# Patient Record
Sex: Male | Born: 1945 | Race: White | Hispanic: No | Marital: Married | Smoking: Never smoker
Health system: Southern US, Community
[De-identification: ages and names within clinical notes are randomized; demographics above are authoritative.]

## PROBLEM LIST (undated history)

## (undated) DIAGNOSIS — Z8673 Personal history of transient ischemic attack (TIA), and cerebral infarction without residual deficits: Secondary | ICD-10-CM

## (undated) DIAGNOSIS — Z87898 Personal history of other specified conditions: Secondary | ICD-10-CM

## (undated) HISTORY — PX: TONSILLECTOMY: SUR1361

## (undated) HISTORY — PX: KNEE SURGERY: SHX244

## (undated) HISTORY — DX: Personal history of other specified conditions: Z87.898

## (undated) HISTORY — DX: Personal history of transient ischemic attack (TIA), and cerebral infarction without residual deficits: Z86.73

---

## 2021-02-05 ENCOUNTER — Other Ambulatory Visit: Payer: Self-pay

## 2021-02-05 ENCOUNTER — Emergency Department (HOSPITAL_COMMUNITY): Payer: 59

## 2021-02-05 ENCOUNTER — Encounter (HOSPITAL_COMMUNITY): Payer: Self-pay | Admitting: Emergency Medicine

## 2021-02-05 ENCOUNTER — Observation Stay (HOSPITAL_COMMUNITY)
Admission: EM | Admit: 2021-02-05 | Discharge: 2021-02-06 | Disposition: A | Discharge status: Home / Self Care | Payer: 59 | Attending: Family Medicine | Admitting: Family Medicine

## 2021-02-05 DIAGNOSIS — Z79899 Other long term (current) drug therapy: Secondary | ICD-10-CM | POA: Insufficient documentation

## 2021-02-05 DIAGNOSIS — I679 Cerebrovascular disease, unspecified: Principal | ICD-10-CM | POA: Insufficient documentation

## 2021-02-05 DIAGNOSIS — R42 Dizziness and giddiness: Secondary | ICD-10-CM

## 2021-02-05 DIAGNOSIS — I719 Aortic aneurysm of unspecified site, without rupture: Secondary | ICD-10-CM | POA: Insufficient documentation

## 2021-02-05 DIAGNOSIS — Y900 Blood alcohol level of less than 20 mg/100 ml: Secondary | ICD-10-CM | POA: Diagnosis not present

## 2021-02-05 DIAGNOSIS — R030 Elevated blood-pressure reading, without diagnosis of hypertension: Secondary | ICD-10-CM | POA: Insufficient documentation

## 2021-02-05 DIAGNOSIS — Z20822 Contact with and (suspected) exposure to covid-19: Secondary | ICD-10-CM | POA: Diagnosis not present

## 2021-02-05 DIAGNOSIS — I639 Cerebral infarction, unspecified: Secondary | ICD-10-CM | POA: Diagnosis not present

## 2021-02-05 LAB — DIFFERENTIAL
Abs Immature Granulocytes: 0.03 10*3/uL (ref 0.00–0.07)
Basophils Absolute: 0.1 10*3/uL (ref 0.0–0.1)
Basophils Relative: 1 %
Eosinophils Absolute: 0.3 10*3/uL (ref 0.0–0.5)
Eosinophils Relative: 4 %
Immature Granulocytes: 1 %
Lymphocytes Relative: 29 %
Lymphs Abs: 1.8 10*3/uL (ref 0.7–4.0)
Monocytes Absolute: 0.7 10*3/uL (ref 0.1–1.0)
Monocytes Relative: 12 %
Neutro Abs: 3.3 10*3/uL (ref 1.7–7.7)
Neutrophils Relative %: 53 %

## 2021-02-05 LAB — RAPID URINE DRUG SCREEN, HOSP PERFORMED
Amphetamines: NOT DETECTED
Barbiturates: NOT DETECTED
Benzodiazepines: NOT DETECTED
Cocaine: NOT DETECTED
Opiates: NOT DETECTED
Tetrahydrocannabinol: NOT DETECTED

## 2021-02-05 LAB — APTT: aPTT: 29 seconds (ref 24–36)

## 2021-02-05 LAB — CBG MONITORING, ED: Glucose-Capillary: 87 mg/dL (ref 70–99)

## 2021-02-05 LAB — I-STAT CHEM 8, ED
BUN: 44 mg/dL — ABNORMAL HIGH (ref 8–23)
Calcium, Ion: 1.08 mmol/L — ABNORMAL LOW (ref 1.15–1.40)
Chloride: 109 mmol/L (ref 98–111)
Creatinine, Ser: 1.3 mg/dL — ABNORMAL HIGH (ref 0.61–1.24)
Glucose, Bld: 93 mg/dL (ref 70–99)
HCT: 49 % (ref 39.0–52.0)
Hemoglobin: 16.7 g/dL (ref 13.0–17.0)
Potassium: 5.9 mmol/L — ABNORMAL HIGH (ref 3.5–5.1)
Sodium: 140 mmol/L (ref 135–145)
TCO2: 25 mmol/L (ref 22–32)

## 2021-02-05 LAB — URINALYSIS, ROUTINE W REFLEX MICROSCOPIC
Bilirubin Urine: NEGATIVE
Glucose, UA: NEGATIVE mg/dL
Hgb urine dipstick: NEGATIVE
Ketones, ur: NEGATIVE mg/dL
Leukocytes,Ua: NEGATIVE
Nitrite: NEGATIVE
Protein, ur: NEGATIVE mg/dL
Specific Gravity, Urine: 1.026 (ref 1.005–1.030)
pH: 5 (ref 5.0–8.0)

## 2021-02-05 LAB — COMPREHENSIVE METABOLIC PANEL
ALT: 25 U/L (ref 0–44)
AST: 22 U/L (ref 15–41)
Albumin: 4.1 g/dL (ref 3.5–5.0)
Alkaline Phosphatase: 100 U/L (ref 38–126)
Anion gap: 8 (ref 5–15)
BUN: 27 mg/dL — ABNORMAL HIGH (ref 8–23)
CO2: 26 mmol/L (ref 22–32)
Calcium: 9.6 mg/dL (ref 8.9–10.3)
Chloride: 108 mmol/L (ref 98–111)
Creatinine, Ser: 1.35 mg/dL — ABNORMAL HIGH (ref 0.61–1.24)
GFR, Estimated: 55 mL/min — ABNORMAL LOW (ref >60)
Glucose, Bld: 95 mg/dL (ref 70–99)
Potassium: 4.5 mmol/L (ref 3.5–5.1)
Sodium: 142 mmol/L (ref 135–145)
Total Bilirubin: 0.6 mg/dL (ref 0.3–1.2)
Total Protein: 6.6 g/dL (ref 6.5–8.1)

## 2021-02-05 LAB — RESP PANEL BY RT-PCR (FLU A&B, COVID) ARPGX2
Influenza A by PCR: NEGATIVE
Influenza B by PCR: NEGATIVE
SARS Coronavirus 2 by RT PCR: NEGATIVE

## 2021-02-05 LAB — CBC
HCT: 50.5 % (ref 39.0–52.0)
Hemoglobin: 16.7 g/dL (ref 13.0–17.0)
MCH: 29.8 pg (ref 26.0–34.0)
MCHC: 33.1 g/dL (ref 30.0–36.0)
MCV: 90 fL (ref 80.0–100.0)
Platelets: 213 10*3/uL (ref 150–400)
RBC: 5.61 MIL/uL (ref 4.22–5.81)
RDW: 13.8 % (ref 11.5–15.5)
WBC: 6.2 10*3/uL (ref 4.0–10.5)
nRBC: 0 % (ref 0.0–0.2)

## 2021-02-05 LAB — PROTIME-INR
INR: 1.1 (ref 0.8–1.2)
Prothrombin Time: 13.8 seconds (ref 11.4–15.2)

## 2021-02-05 LAB — LDL CHOLESTEROL, DIRECT: Direct LDL: 86.5 mg/dL (ref 0–99)

## 2021-02-05 LAB — ETHANOL: Alcohol, Ethyl (B): <10 mg/dL (ref <10)

## 2021-02-05 IMAGING — MR MR HEAD W/O CM
6 of 11 series · 24 of 48 positions shown · IV contrast (gadavist)
Comparison: No pertinent prior exam.

CLINICAL DATA: Neuro deficit, acute, stroke suspected

EXAM:
MRI HEAD WITHOUT CONTRAST
MRA HEAD WITHOUT CONTRAST
MRA OF THE NECK WITHOUT AND WITH CONTRAST
TECHNIQUE: Multiplanar, multi-echo pulse sequences of the brain and surrounding
structures were acquired without intravenous contrast. Angiographic
images of the Circle of Willis were acquired using MRA technique
without intravenous contrast. Angiographic images of the neck were
acquired using MRA technique without and with intravenous contrast.
Carotid stenosis measurements (when applicable) are obtained
utilizing NASCET criteria, using the distal internal carotid
diameter as the denominator.
CONTRAST:  9.5mL GADAVIST GADOBUTROL 1 MMOL/ML IV SOLN

[Series 3: DWI · axial · 3.0mm · 0.94mm/px · z∈[-34,+122]mm · 7 of 108 slices shown (1 of 2)]
[im 1/108]
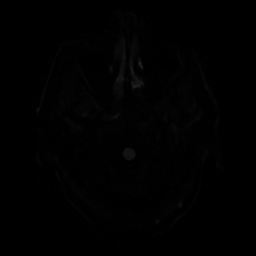
[im 18/108]
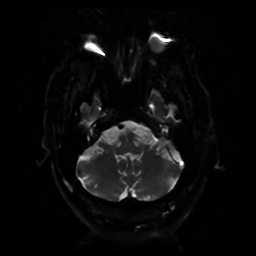
[im 36/108]
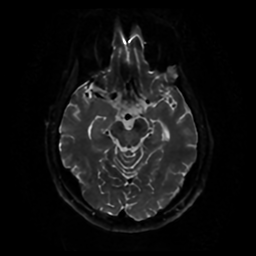
[im 54/108]
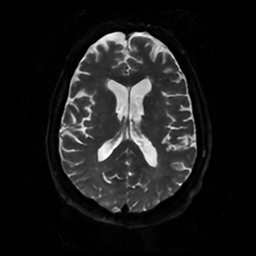
[im 72/108]
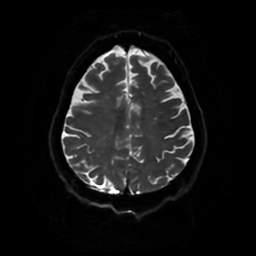
[im 90/108]
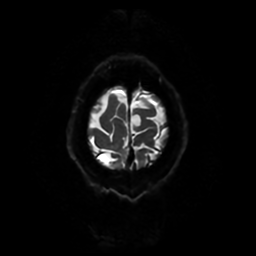
[im 108/108]
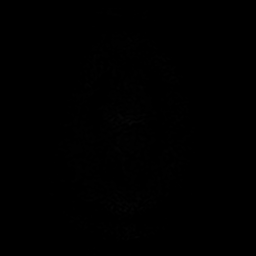

[Series 4: DWI · coronal · 4.0mm · 0.94mm/px · 5 of 77 slices shown (2 of 2)]
[im 1/77]
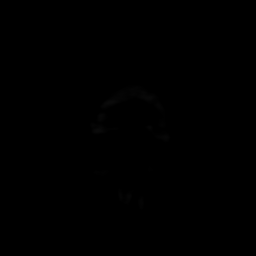
[im 20/77]
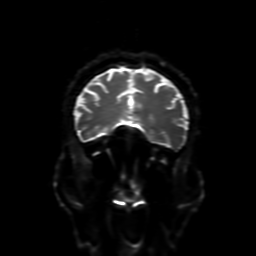
[im 39/77]
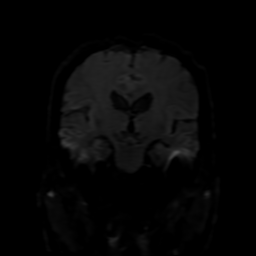
[im 58/77]
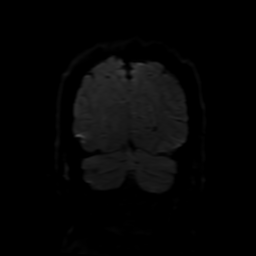
[im 77/77]
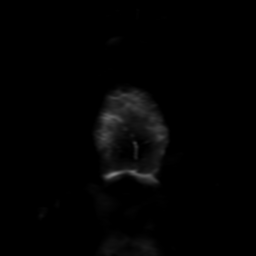

[Series 5: FLAIR · sagittal · 5.0mm · 0.23mm/px · 2 of 27 slices shown (1 of 2)]
[im 1/27]
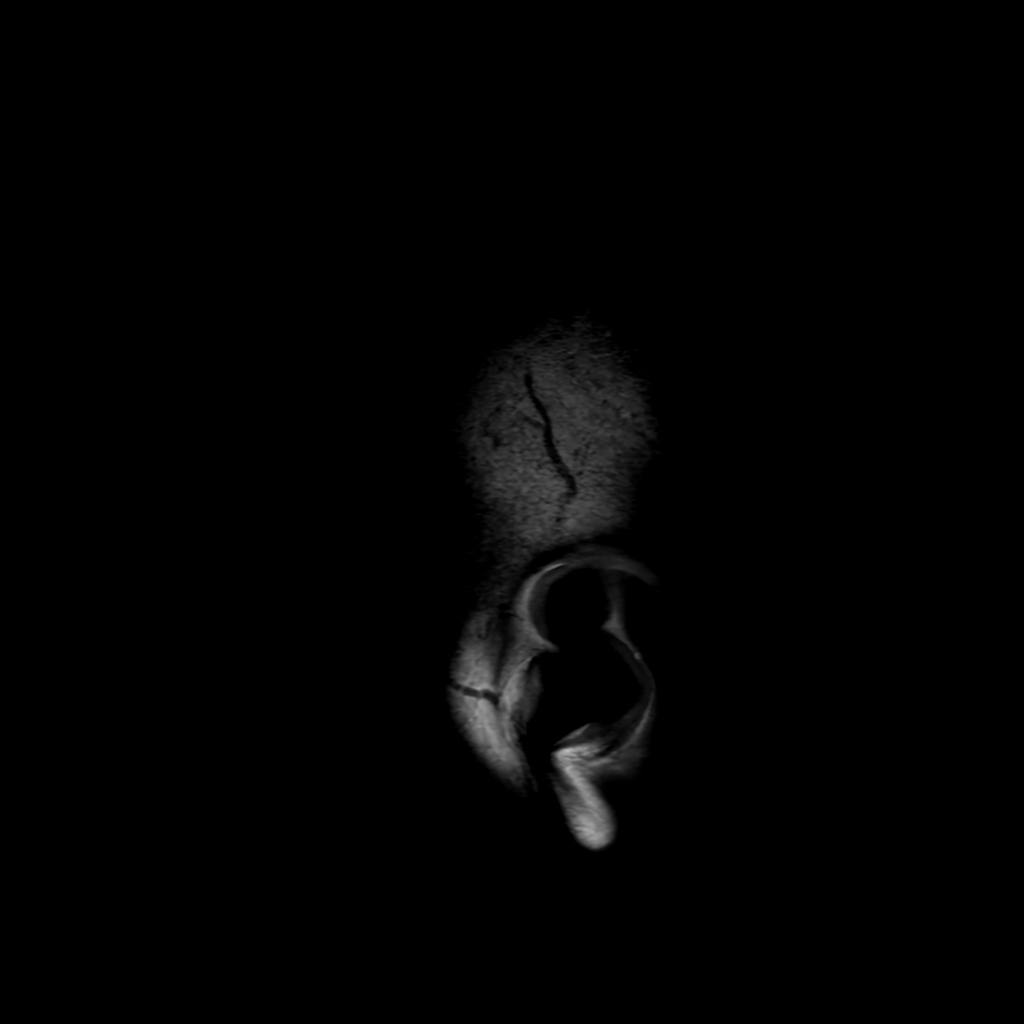
[im 27/27]
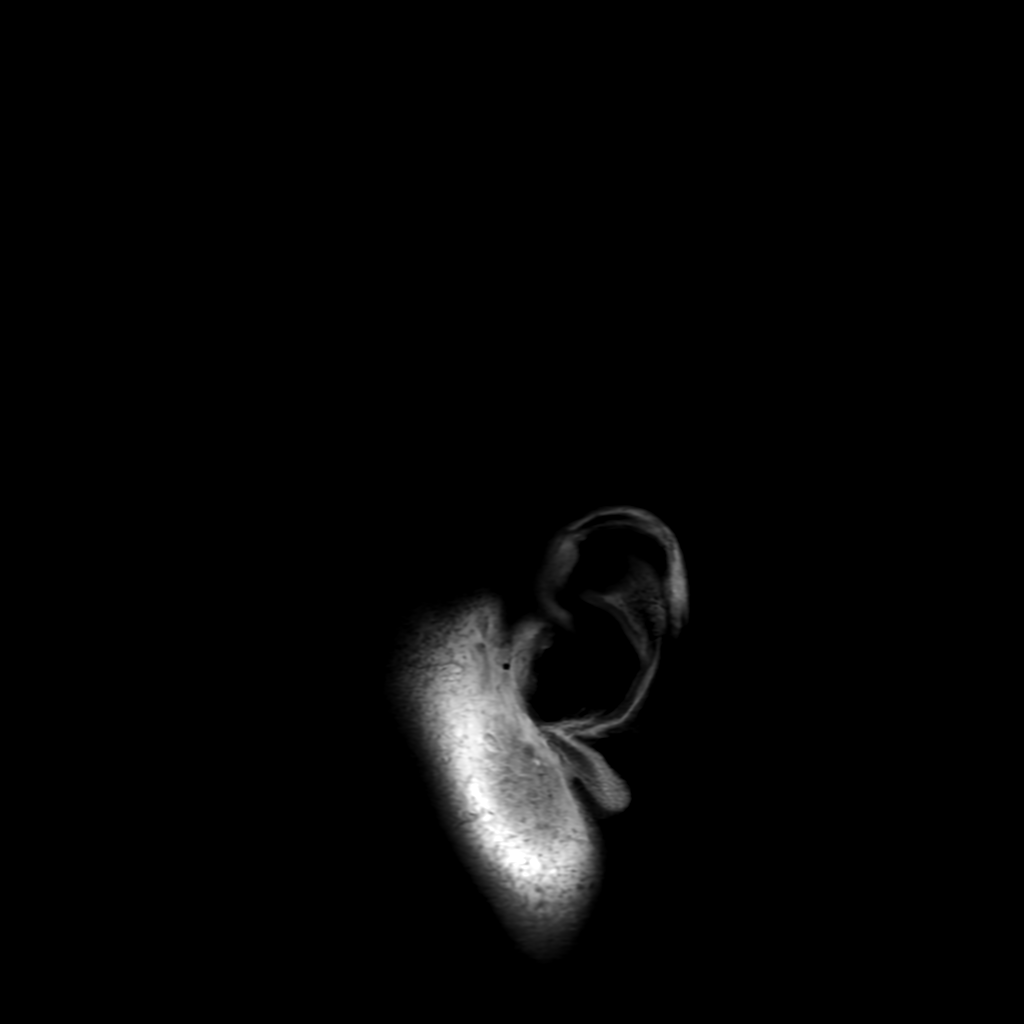

[Series 7: FLAIR · axial · 4.0mm · 0.45mm/px · z∈[-29,+123]mm · 3 of 36 slices shown (2 of 2)]
[im 1/36]
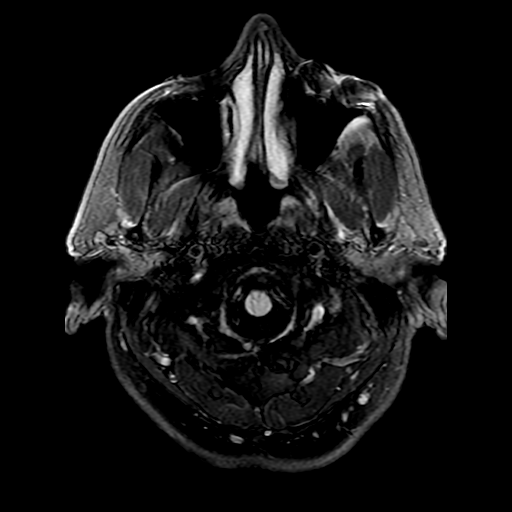
[im 18/36]
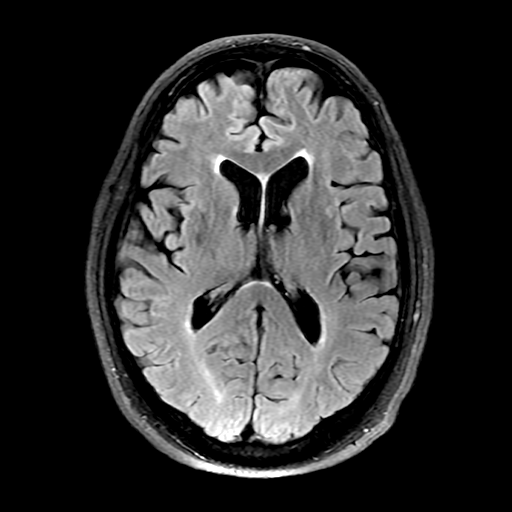
[im 36/36]
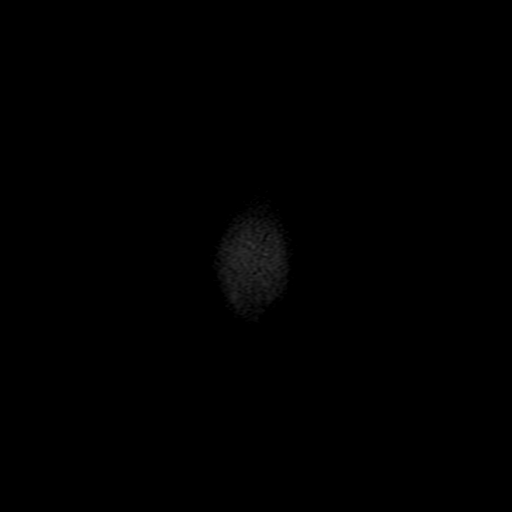

[Series 350: ADC · axial · 3.0mm · 0.94mm/px · z∈[-34,+122]mm · 4 of 54 slices shown (1 of 2)]
[im 1/54]
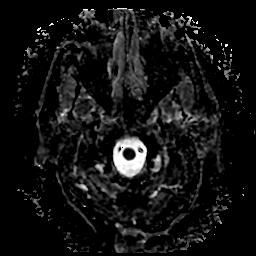
[im 18/54]
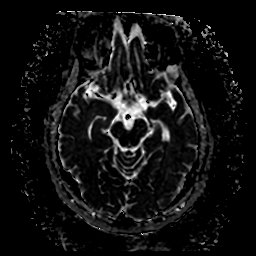
[im 36/54]
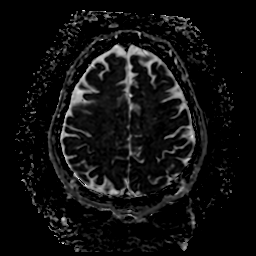
[im 54/54]
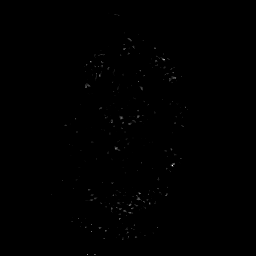

[Series 450: ADC · coronal · 4.0mm · 0.94mm/px · 3 of 39 slices shown (2 of 2)]
[im 1/39]
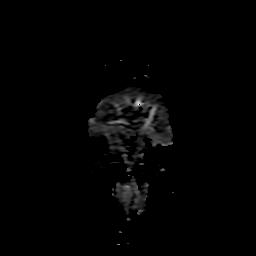
[im 20/39]
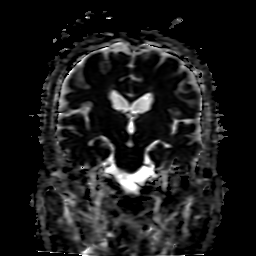
[im 39/39]
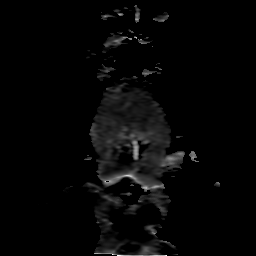

[24 of 48 positions shown; findings below may reference images not displayed]

FINDINGS: MR HEAD FINDINGS

Brain: There is hyperintensity diffusion-weighted imaging the
paramedian superior left frontal lobe. No acute or chronic
hemorrhage. There is multifocal hyperintense T2-weighted signal
within the white matter. Generalized volume loss without a clear
lobar predilection. The midline structures are normal.

Vascular: Major flow voids are preserved.

Skull and upper cervical spine: Normal calvarium and skull base.
Visualized upper cervical spine and soft tissues are normal.

Sinuses/Orbits:No paranasal sinus fluid levels or advanced mucosal
thickening. No mastoid or middle ear effusion. Normal orbits.

MRA HEAD FINDINGS

POSTERIOR CIRCULATION:

--Vertebral arteries: Normal

--Inferior cerebellar arteries: Normal.

--Basilar artery: Normal.

--Superior cerebellar arteries: Normal.

--Posterior cerebral arteries: Normal.

ANTERIOR CIRCULATION:

--Intracranial internal carotid arteries: Normal.

--Anterior cerebral arteries (ACA): Normal.

--Middle cerebral arteries (MCA): Normal.

ANATOMIC VARIANTS: None

MRA NECK FINDINGS

Aortic arch: Standard branching pattern

Right carotid system: Normal

Left carotid system: Normal

Vertebral arteries: Normal left dominant system.

Other: None.
IMPRESSION: 1. Small focus of acute ischemia within the paramedian superior left
frontal lobe. No hemorrhage or mass effect.
2. Normal MRA of the head and neck.
3. Mild chronic small vessel disease and volume loss.

## 2021-02-05 IMAGING — CT CT HEAD CODE STROKE
3 series · 15 of 47 positions shown, 18 images · non-contrast
Comparison: None.

CLINICAL DATA: Code stroke.  Neuro deficit, acute stroke suspected.

EXAM:
CT HEAD WITHOUT CONTRAST
TECHNIQUE: Contiguous axial images were obtained from the base of the skull
through the vertex without intravenous contrast.

[Series 3: head 5.0 st · axial · 0.43mm/px · z∈[-172,-37]mm · 9 of 33 slices shown, 12 images]
[im 3/33  brain]
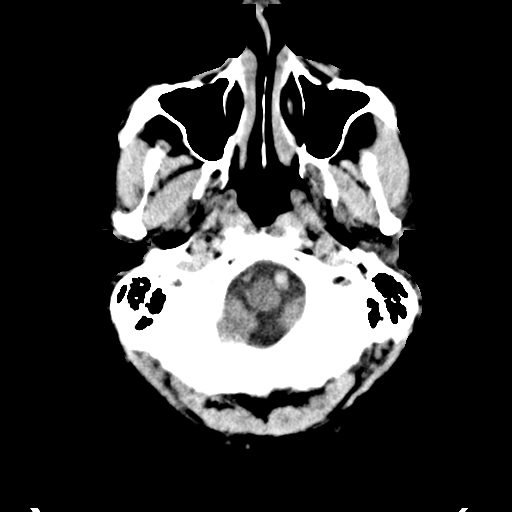
[im 3/33  bone]
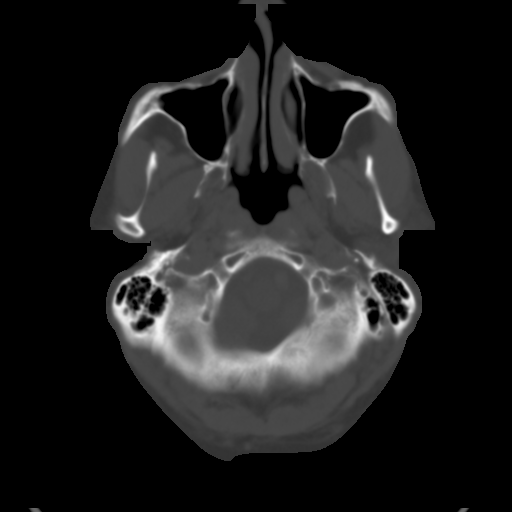
[im 6/33  brain]
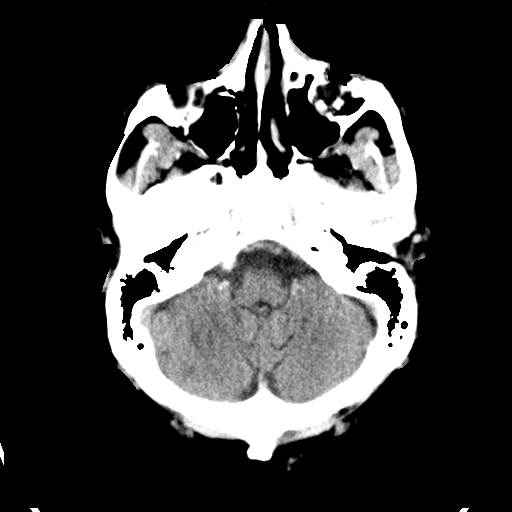
[im 9/33  brain]
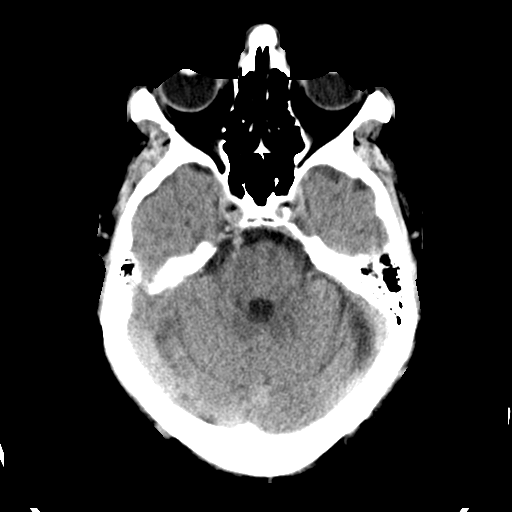
[im 13/33  brain]
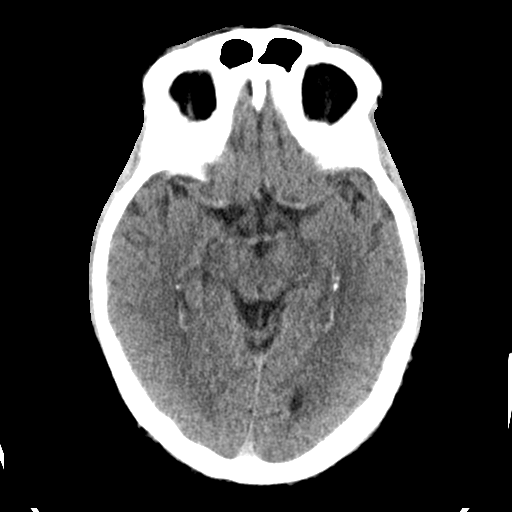
[im 17/33  brain]
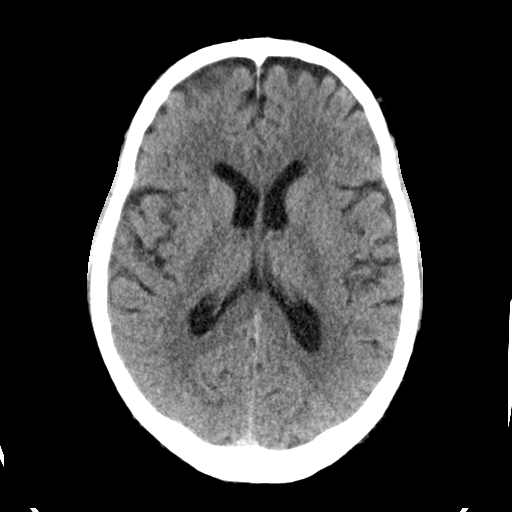
[im 17/33  bone]
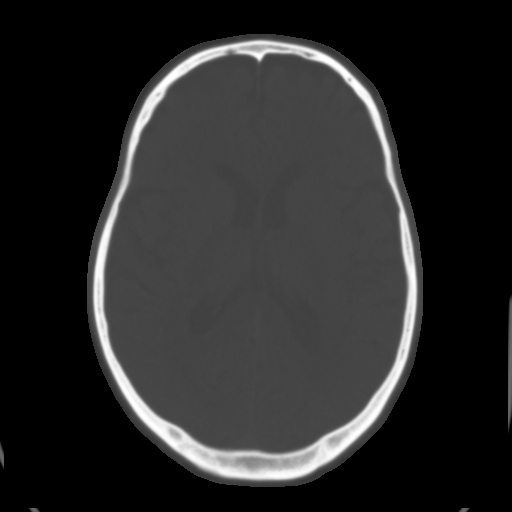
[im 20/33  brain]
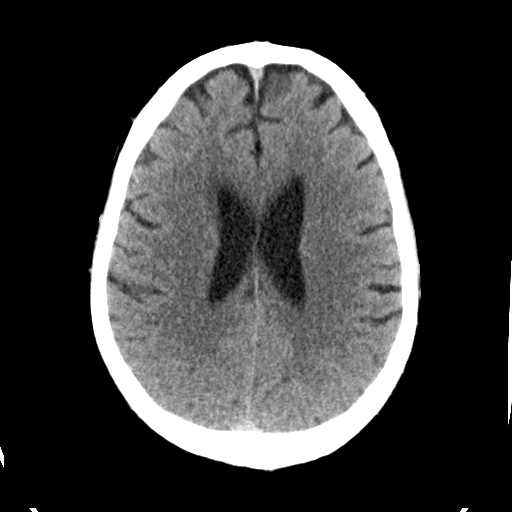
[im 24/33  brain]
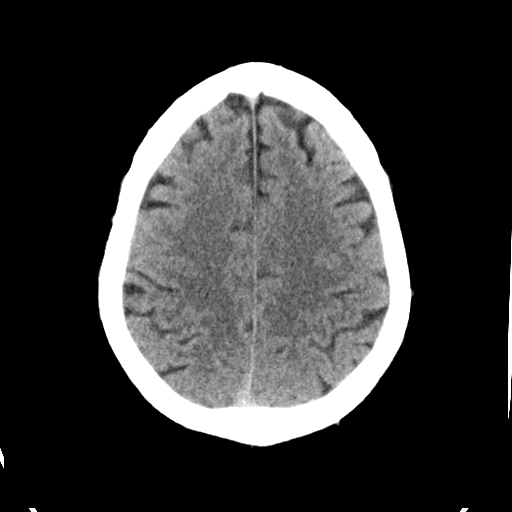
[im 27/33  brain]
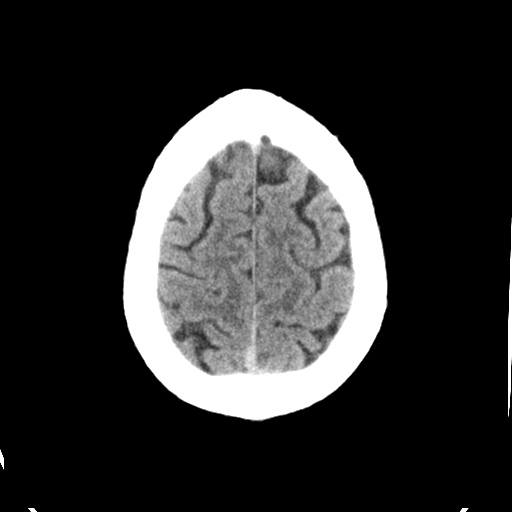
[im 30/33  brain]
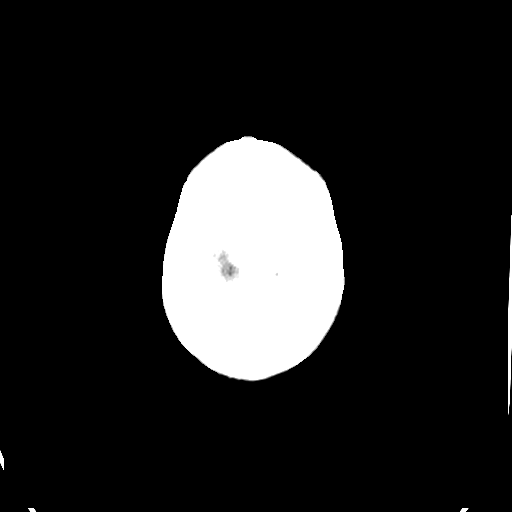
[im 30/33  bone]
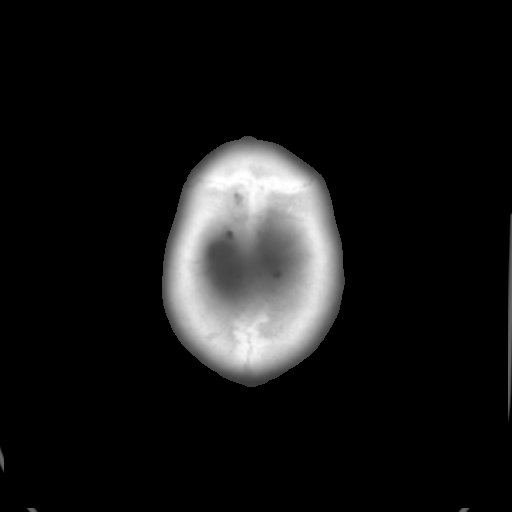

[Series 5: head 3.0 cor st · coronal · 0.32mm/px · 3 of 67 slices shown]
[im 23/67  brain]
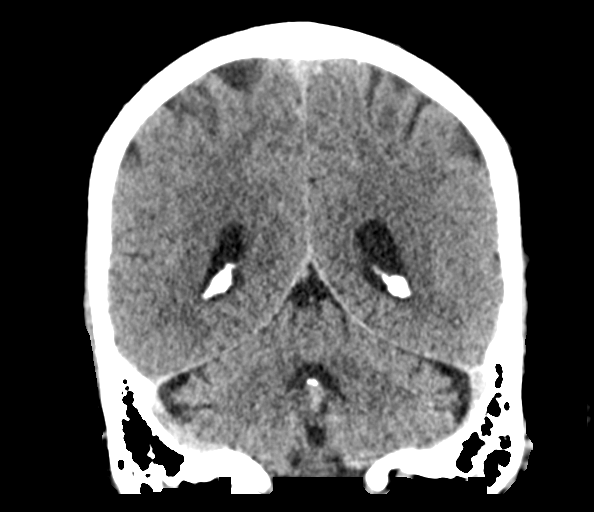
[im 30/67  brain]
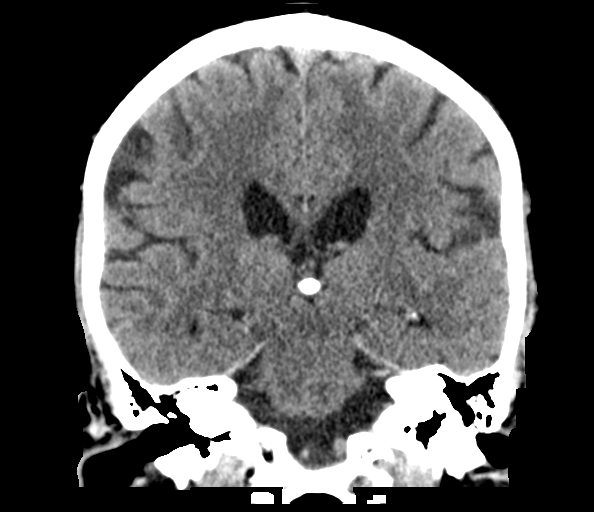
[im 37/67  brain]
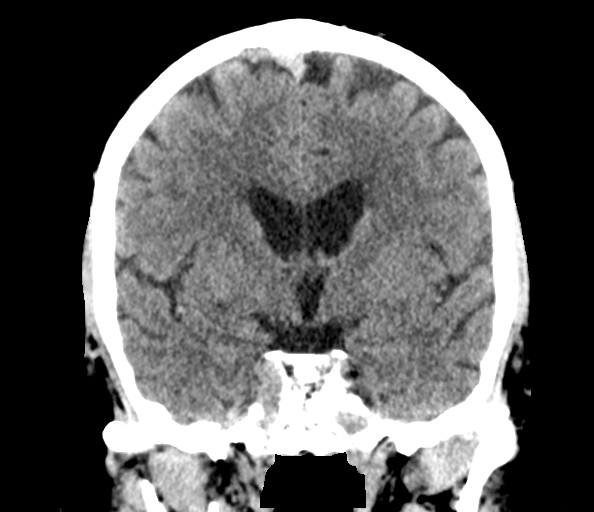

[Series 6: head 3.0 sag st · sagittal · 0.32mm/px · 3 of 64 slices shown]
[im 22/64  brain]
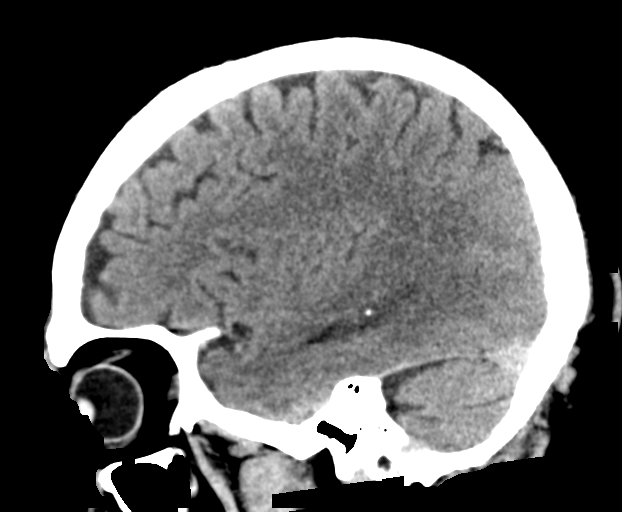
[im 32/64  brain]
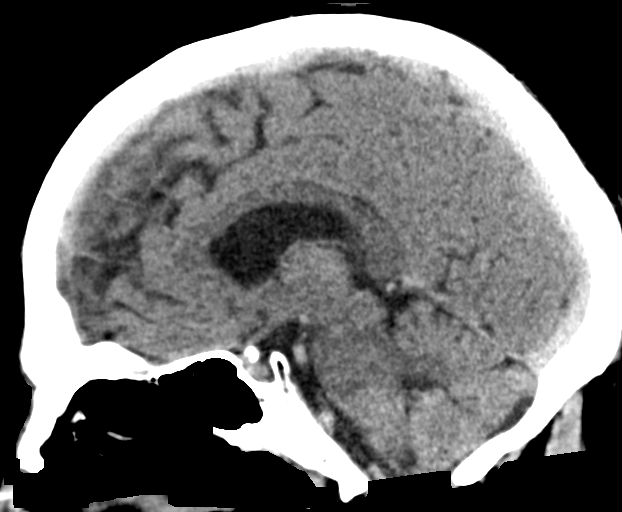
[im 43/64  brain]
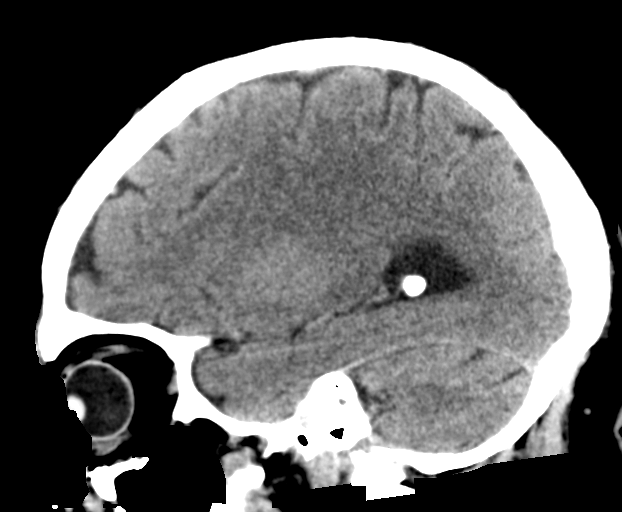

[15 of 47 positions shown; findings below may reference images not displayed]

FINDINGS: Brain: Hypodensity within the paramidline left cerebellar
hemisphere, compatible with the age indeterminate lacunar infarct.

Vascular: No hyperdense vessel identified. Calcific atherosclerosis.

Skull: No acute fracture.

Sinuses/Orbits: Clear sinuses.

Other: No mastoid effusions.

ASPECTS ([place] Stroke Program Early CT Score) Total score (0-10
with 10 being normal): 10.
IMPRESSION: 1. Age indeterminate small left cerebellar lacunar infarct, possibly
remote. An MRI could better evaluate if clinically indicated.
2. No evidence of acute large vascular territory infarct or acute
hemorrhage. ASPECTS is 10.

Code stroke imaging results were communicated on [DATE] at [DATE] to provider [REDACTED] via secure text paging.

## 2021-02-05 IMAGING — MR MR MRA NECK WO/W CM
4 of 7 series · 18 of 48 positions shown · IV contrast (9.5 M GAD)
Comparison: No pertinent prior exam.

CLINICAL DATA: Neuro deficit, acute, stroke suspected

EXAM:
MRI HEAD WITHOUT CONTRAST
MRA HEAD WITHOUT CONTRAST
MRA OF THE NECK WITHOUT AND WITH CONTRAST
TECHNIQUE: Multiplanar, multi-echo pulse sequences of the brain and surrounding
structures were acquired without intravenous contrast. Angiographic
images of the Circle of Willis were acquired using MRA technique
without intravenous contrast. Angiographic images of the neck were
acquired using MRA technique without and with intravenous contrast.
Carotid stenosis measurements (when applicable) are obtained
utilizing NASCET criteria, using the distal internal carotid
diameter as the denominator.
CONTRAST:  9.5mL GADAVIST GADOBUTROL 1 MMOL/ML IV SOLN

[Series 400: cor cemra ft · coronal · 1.2mm · 0.59mm/px · 7 of 117 slices shown]
[im 1/117]
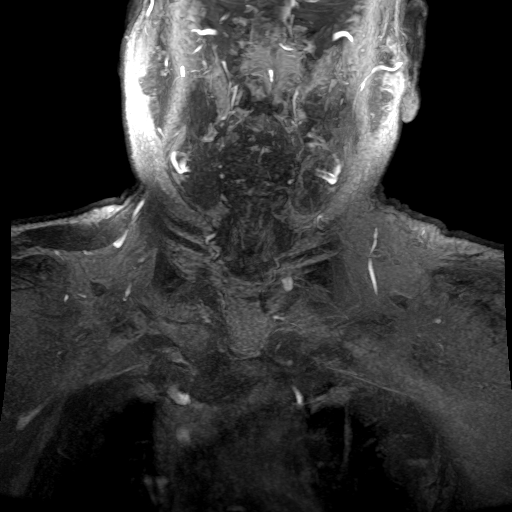
[im 20/117]
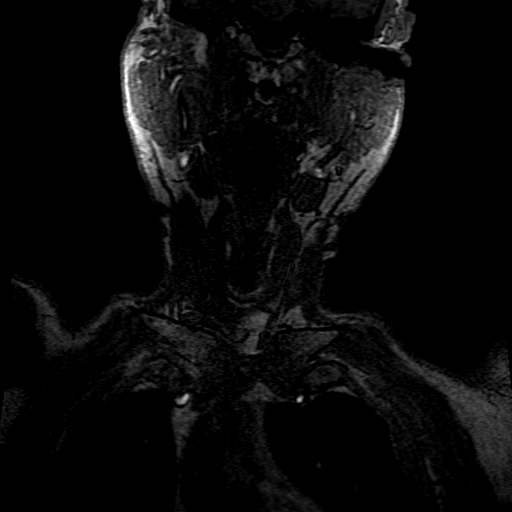
[im 39/117]
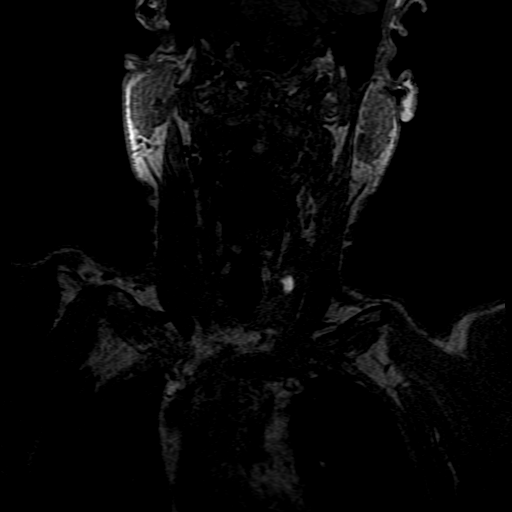
[im 59/117]
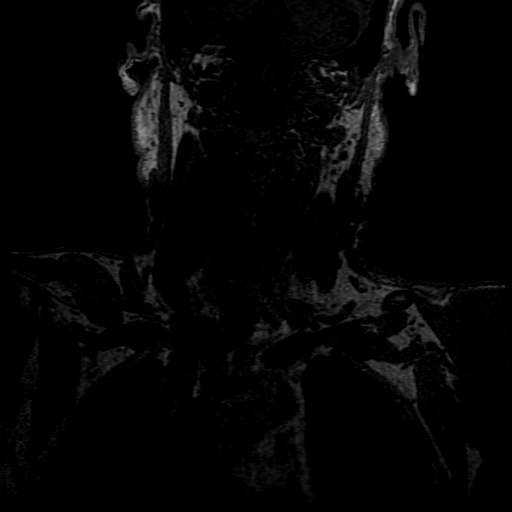
[im 78/117]
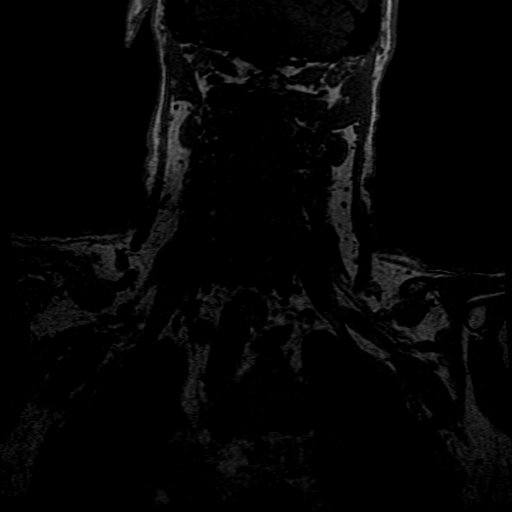
[im 97/117]
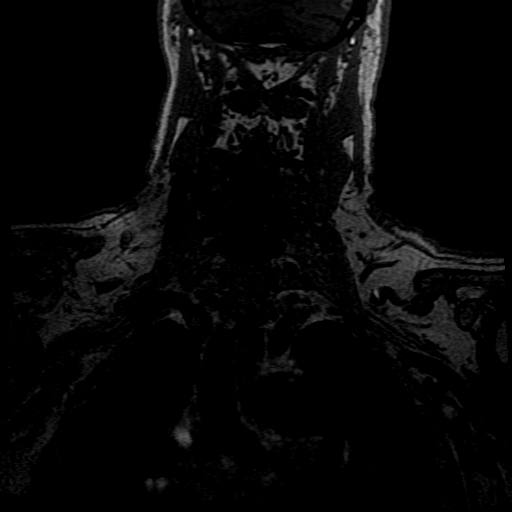
[im 117/117]
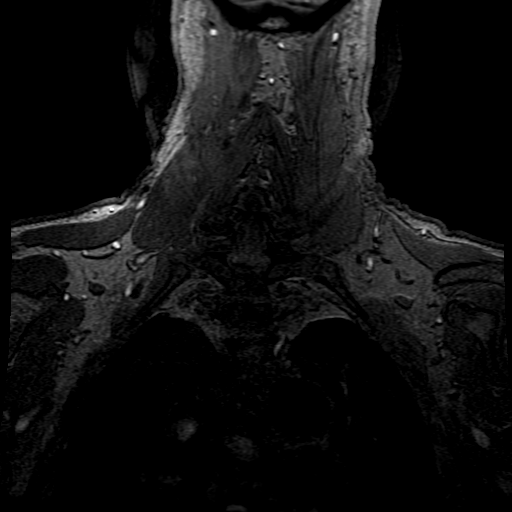

[Series 401: ph1/cor cemra ft · coronal · 1.2mm · 0.59mm/px · 5 of 116 slices shown]
[im 1/116]
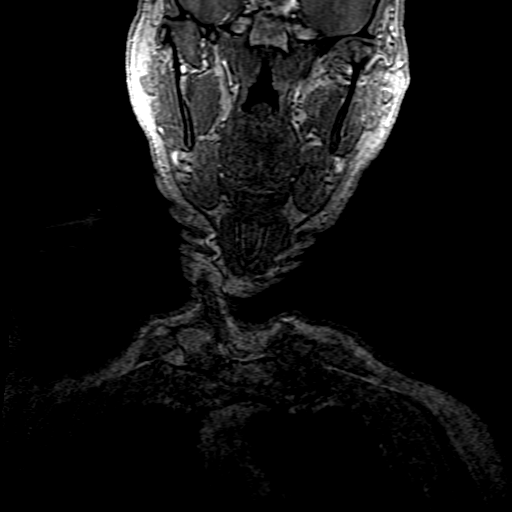
[im 20/116]
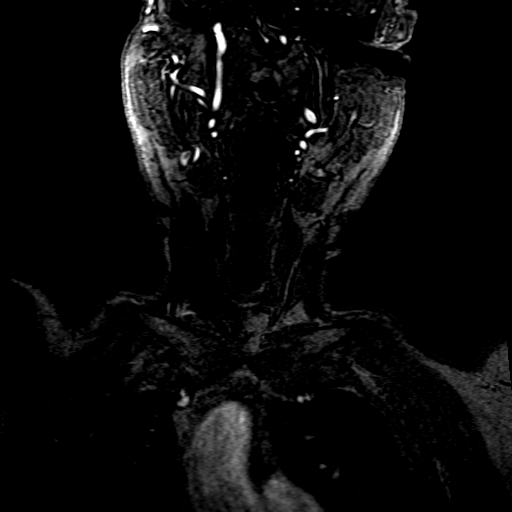
[im 39/116]
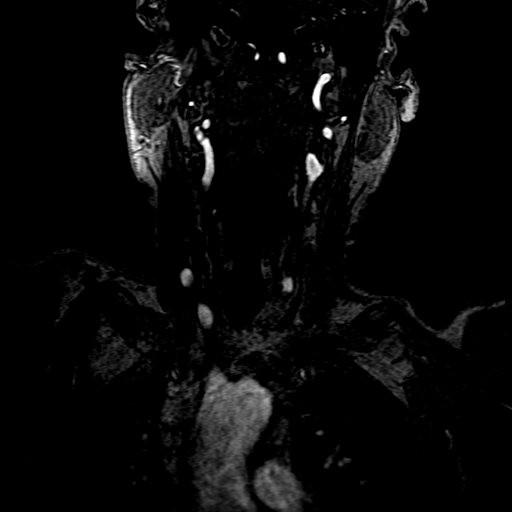
[im 58/116]
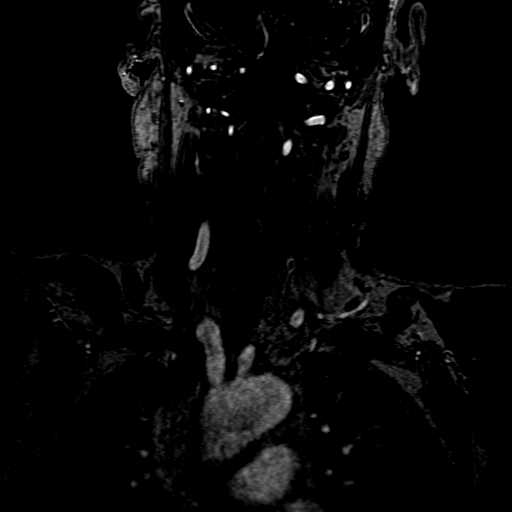
[im 96/116]
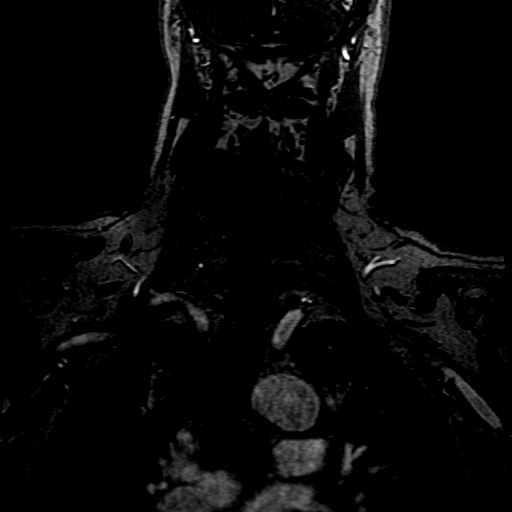

[Series 402: ph2/cor cemra ft · coronal · 1.2mm · 0.59mm/px · 3 of 117 slices shown]
[im 17/117]
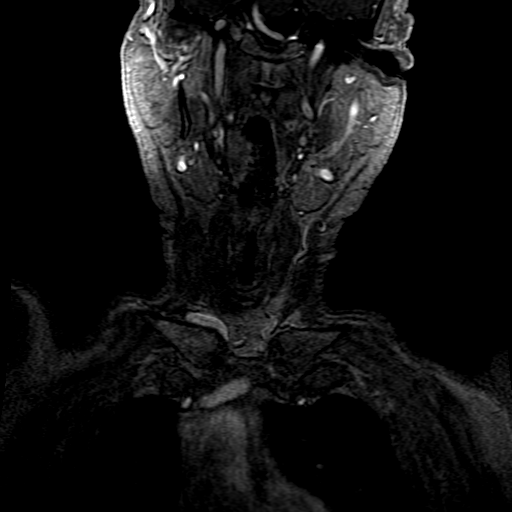
[im 67/117]
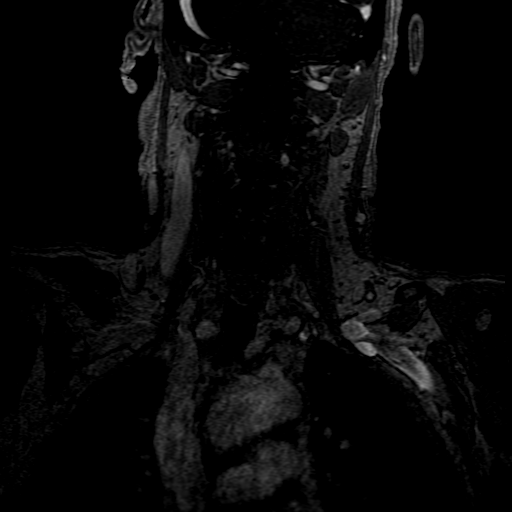
[im 100/117]
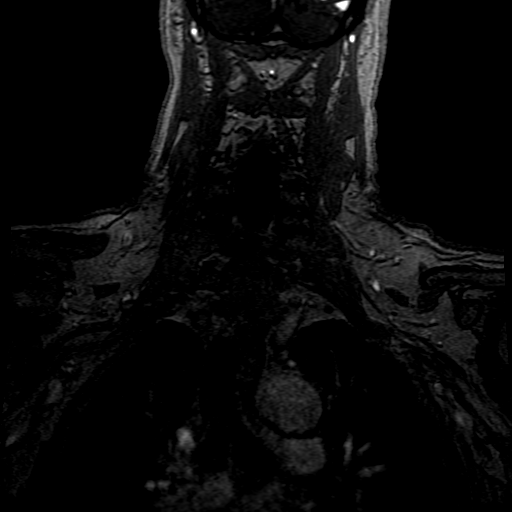

[((date))-((date)) · coronal · 1.2mm · 0.59mm/px · 3 of 117 slices shown]
[im 17/117]
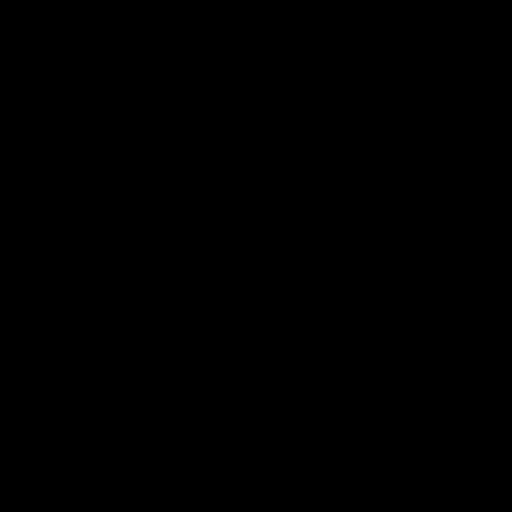
[im 67/117]
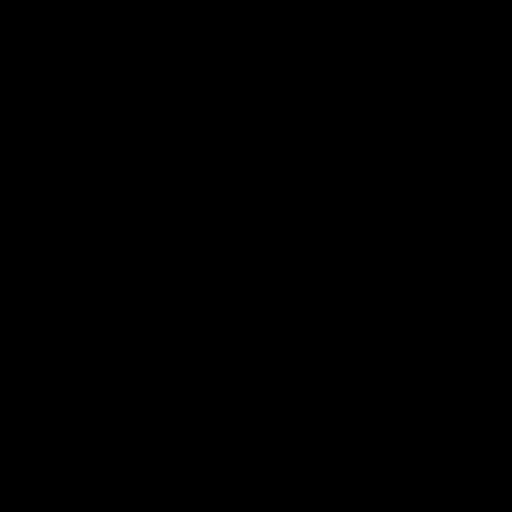
[im 100/117]
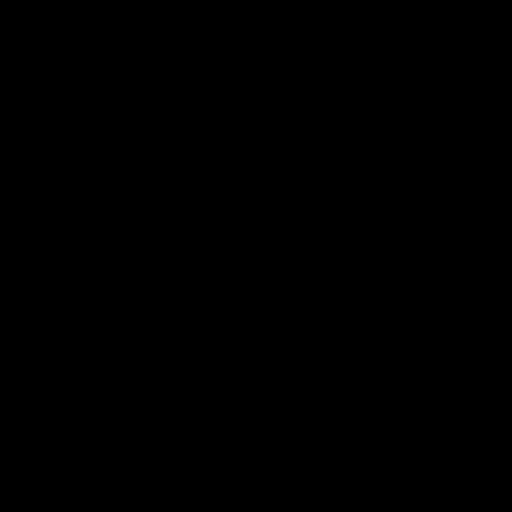

[18 of 48 positions shown; findings below may reference images not displayed]

FINDINGS: MR HEAD FINDINGS

Brain: There is hyperintensity diffusion-weighted imaging the
paramedian superior left frontal lobe. No acute or chronic
hemorrhage. There is multifocal hyperintense T2-weighted signal
within the white matter. Generalized volume loss without a clear
lobar predilection. The midline structures are normal.

Vascular: Major flow voids are preserved.

Skull and upper cervical spine: Normal calvarium and skull base.
Visualized upper cervical spine and soft tissues are normal.

Sinuses/Orbits:No paranasal sinus fluid levels or advanced mucosal
thickening. No mastoid or middle ear effusion. Normal orbits.

MRA HEAD FINDINGS

POSTERIOR CIRCULATION:

--Vertebral arteries: Normal

--Inferior cerebellar arteries: Normal.

--Basilar artery: Normal.

--Superior cerebellar arteries: Normal.

--Posterior cerebral arteries: Normal.

ANTERIOR CIRCULATION:

--Intracranial internal carotid arteries: Normal.

--Anterior cerebral arteries (ACA): Normal.

--Middle cerebral arteries (MCA): Normal.

ANATOMIC VARIANTS: None

MRA NECK FINDINGS

Aortic arch: Standard branching pattern

Right carotid system: Normal

Left carotid system: Normal

Vertebral arteries: Normal left dominant system.

Other: None.
IMPRESSION: 1. Small focus of acute ischemia within the paramedian superior left
frontal lobe. No hemorrhage or mass effect.
2. Normal MRA of the head and neck.
3. Mild chronic small vessel disease and volume loss.

## 2021-02-05 MED ORDER — ACETAMINOPHEN 650 MG RE SUPP: 650.0000 mg | RECTAL | Status: DC | PRN

## 2021-02-05 MED ORDER — GADOBUTROL 1 MMOL/ML IV SOLN
9.5000 mL | Freq: Once | INTRAVENOUS | Status: AC | PRN
  Administered 2021-02-05: 9.5 mL via INTRAVENOUS

## 2021-02-05 MED ORDER — HYDRALAZINE HCL 25 MG PO TABS: 25.0000 mg | ORAL_TABLET | Freq: Four times a day (QID) | ORAL | Status: DC | PRN

## 2021-02-05 MED ORDER — ASPIRIN 81 MG PO CHEW
81.0000 mg | CHEWABLE_TABLET | Freq: Every day | ORAL | Status: DC
  Administered 2021-02-05 – 2021-02-06 (×2): 81 mg via ORAL
  Filled 2021-02-05 (×2): qty 1

## 2021-02-05 MED ORDER — ASPIRIN 300 MG RE SUPP: 300.0000 mg | Freq: Every day | RECTAL | Status: DC

## 2021-02-05 MED ORDER — ATORVASTATIN CALCIUM 80 MG PO TABS
80.0000 mg | ORAL_TABLET | Freq: Every day | ORAL | Status: DC
Start: 2021-02-05 — End: 2021-02-06
  Administered 2021-02-05 – 2021-02-06 (×2): 80 mg via ORAL
  Filled 2021-02-05: qty 1
  Filled 2021-02-05: qty 2

## 2021-02-05 MED ORDER — STROKE: EARLY STAGES OF RECOVERY BOOK: Freq: Once | Status: DC

## 2021-02-05 MED ORDER — SENNOSIDES-DOCUSATE SODIUM 8.6-50 MG PO TABS: 1.0000 | ORAL_TABLET | Freq: Every evening | ORAL | Status: DC | PRN

## 2021-02-05 MED ORDER — ACETAMINOPHEN 160 MG/5ML PO SOLN: 650.0000 mg | ORAL | Status: DC | PRN

## 2021-02-05 MED ORDER — ENOXAPARIN SODIUM 40 MG/0.4ML IJ SOSY
40.0000 mg | PREFILLED_SYRINGE | INTRAMUSCULAR | Status: DC
  Administered 2021-02-05: 40 mg via SUBCUTANEOUS
  Filled 2021-02-05: qty 0.4

## 2021-02-05 MED ORDER — MECLIZINE HCL 25 MG PO TABS: 12.5000 mg | ORAL_TABLET | Freq: Three times a day (TID) | ORAL | Status: DC | PRN

## 2021-02-05 MED ORDER — ACETAMINOPHEN 325 MG PO TABS: 650.0000 mg | ORAL_TABLET | ORAL | Status: DC | PRN

## 2021-02-05 NOTE — ED Provider Notes (Signed)
Emergency Medicine Provider Triage Evaluation Note  Thomas Roberts , a 75 y.o. male  was evaluated in triage.  Pt complains of who presents with concern for right lower extremity weakness and sudden lightheadedness that started at approximately 345 this afternoon while walking at the grocery store.  No history of stroke in the past.  States that his leg is bent but getting progressively weaker since that time.  Denies any headache but does continue to have intermittent lightheadedness.  He is not anticoagulated.  Review of Systems  Positive: Right lower extremity weakness, lightheadedness Negative: Blurry, double vision, slurred speech, nausea, vomiting, diarrhea, chest pain, palpitations, shortness of breath  Physical Exam  BP (!) 157/90 (BP Location: Right Arm)   Pulse 66   Temp (!) 97.5 F (36.4 C) (Oral)   Resp 16   Ht 6\' 1"  (1.854 m)   Wt 92.1 kg   SpO2 99%   BMI 26.78 kg/m  Gen:   Awake, no distress   Resp:  Normal effort  MSK:   Moves extremities without difficulty  Other:  Cranial nerves are intact, PERRL, EOMI, sensation is intact in all 4 extremities.  Coordination is intact in the upper extremities bilaterally in the left lower extremity.  Significant right lower extremity weakness relative to the left both with knee flexion and extension and plantar and dorsiflexion.  Impaired coordination with right lower extremity as well.  Patient unable to stand due to weakness in the right leg.   Medical Decision Making  Medically screening exam initiated at 4:55 PM.  Appropriate orders placed.  [name] was informed that the remainder of the evaluation will be completed by another provider, this initial triage assessment does not replace that evaluation, and the importance of remaining in the ED until their evaluation is complete.  Code stroke activated, charge nurse notified, CT notified.  Patient assigned to room 32.  Labs being obtained and will be transported to CT 2.  This  chart was dictated using voice recognition software, Dragon. Despite the best efforts of this provider to proofread and correct errors, errors may still occur which can change documentation meaning.    Thomas Roberts 02/05/21 1659    Thomas Roberts, 02/07/21, MD 02/05/21 02/07/21

## 2021-02-05 NOTE — ED Provider Notes (Signed)
[facility] EMERGENCY DEPARTMENT Provider Note   CSN: [id number] Arrival date & time: 02/05/21  4098  An emergency department physician performed an initial assessment on this suspected stroke patient at 1700.  History Chief Complaint  Patient presents with   Dizziness   Weakness    Thomas Roberts is a 75 y.o. male.  The history is provided by the patient.  Dizziness Associated symptoms: weakness   Weakness Associated symptoms: dizziness   Thomas Roberts is a 75 y.o. male who presents to the Emergency Department complaining of weakness.  He presents to the ED complaining of acute RLE weakness and dizziness that started this afternoon.  He was last known well at 330 this afternoon. Sxs started while he was in line at the grocery store.  He felt lightheaded and his right leg kept buckling on him.  At time of ED arrival he states that sxs are starting to improve.  He has no known medical problems and takes no medications.     History reviewed. No pertinent past medical history.  Patient Active Problem List   Diagnosis Date Noted   Acute CVA (cerebrovascular accident) (HCC) 02/05/2021    History reviewed. No pertinent surgical history.     History reviewed. No pertinent family history.  Social History   Tobacco Use   Smoking status: Never   Smokeless tobacco: Never  Substance Use Topics   Alcohol use: Not Currently   Drug use: Not Currently    Home Medications Prior to Admission medications   Not on File    Allergies    Patient has no known allergies.  Review of Systems   Review of Systems  Neurological:  Positive for dizziness and weakness.  All other systems reviewed and are negative.  Physical Exam Updated Vital Signs BP (!) 152/91   Pulse 76   Temp (!) 97.5 F (36.4 C) (Oral)   Resp 12   Ht 6\' 1"  (1.854 m)   Wt 93.6 kg   SpO2 98%   BMI 27.22 kg/m   Physical Exam Vitals and nursing note reviewed.  Constitutional:       Appearance: He is well-developed.  HENT:     Head: Normocephalic and atraumatic.  Cardiovascular:     Rate and Rhythm: Normal rate and regular rhythm.     Heart sounds: No murmur heard. Pulmonary:     Effort: Pulmonary effort is normal. No respiratory distress.     Breath sounds: Normal breath sounds.  Abdominal:     Palpations: Abdomen is soft.     Tenderness: There is no abdominal tenderness. There is no guarding or rebound.  Musculoskeletal:        General: No swelling or tenderness.  Skin:    General: Skin is warm and dry.  Neurological:     Mental Status: He is alert and oriented to person, place, and time.     Comments: No asymmetry of facial movement.  5/5 strength in BUE.  No pronator drift.  There is 4+/5 strength in the RLE.  5/5 strength in the LLE.  Sensation to light touch intact in all four extremities.   Psychiatric:        Behavior: Behavior normal.    ED Results / Procedures / Treatments   Labs (all labs ordered are listed, but only abnormal results are displayed) Labs Reviewed  COMPREHENSIVE METABOLIC PANEL - Abnormal; Notable for the following components:      Result Value   BUN 27 (*)  Creatinine, Ser 1.35 (*)    GFR, Estimated 55 (*)    All other components within normal limits  I-STAT CHEM 8, ED - Abnormal; Notable for the following components:   Potassium 5.9 (*)    BUN 44 (*)    Creatinine, Ser 1.30 (*)    Calcium, Ion 1.08 (*)    All other components within normal limits  RESP PANEL BY RT-PCR (FLU A&B, COVID) ARPGX2  ETHANOL  PROTIME-INR  APTT  CBC  DIFFERENTIAL  RAPID URINE DRUG SCREEN, HOSP PERFORMED  URINALYSIS, ROUTINE W REFLEX MICROSCOPIC  LDL CHOLESTEROL, DIRECT  HEMOGLOBIN A1C  LIPID PANEL  CBG MONITORING, ED    EKG None  Radiology MR ANGIO HEAD WO CONTRAST  Result Date: 02/05/2021 CLINICAL DATA:  Neuro deficit, acute, stroke suspected EXAM: MRI HEAD WITHOUT CONTRAST MRA HEAD WITHOUT CONTRAST MRA OF THE NECK WITHOUT AND WITH  CONTRAST TECHNIQUE: Multiplanar, multi-echo pulse sequences of the brain and surrounding structures were acquired without intravenous contrast. Angiographic images of the Circle of Willis were acquired using MRA technique without intravenous contrast. Angiographic images of the neck were acquired using MRA technique without and with intravenous contrast. Carotid stenosis measurements (when applicable) are obtained utilizing NASCET criteria, using the distal internal carotid diameter as the denominator. CONTRAST:  9.53mL GADAVIST GADOBUTROL 1 MMOL/ML IV SOLN COMPARISON:  No pertinent prior exam. FINDINGS: MR HEAD FINDINGS Brain: There is hyperintensity diffusion-weighted imaging the paramedian superior left frontal lobe. No acute or chronic hemorrhage. There is multifocal hyperintense T2-weighted signal within the white matter. Generalized volume loss without a clear lobar predilection. The midline structures are normal. Vascular: Major flow voids are preserved. Skull and upper cervical spine: Normal calvarium and skull base. Visualized upper cervical spine and soft tissues are normal. Sinuses/Orbits:No paranasal sinus fluid levels or advanced mucosal thickening. No mastoid or middle ear effusion. Normal orbits. MRA HEAD FINDINGS POSTERIOR CIRCULATION: --Vertebral arteries: Normal --Inferior cerebellar arteries: Normal. --Basilar artery: Normal. --Superior cerebellar arteries: Normal. --Posterior cerebral arteries: Normal. ANTERIOR CIRCULATION: --Intracranial internal carotid arteries: Normal. --Anterior cerebral arteries (ACA): Normal. --Middle cerebral arteries (MCA): Normal. ANATOMIC VARIANTS: None MRA NECK FINDINGS Aortic arch: Standard branching pattern Right carotid system: Normal Left carotid system: Normal Vertebral arteries: Normal left dominant system. Other: None. IMPRESSION: 1. Small focus of acute ischemia within the paramedian superior left frontal lobe. No hemorrhage or mass effect. 2. Normal MRA of the  head and neck. 3. 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 Electronically Signed   By: Deatra Robinson M.D.   On: 02/05/2021 20:58   MR ANGIO NECK W WO CONTRAST  Result Date: 02/05/2021 CLINICAL DATA:  Neuro deficit, acute, stroke suspected EXAM: MRI HEAD WITHOUT CONTRAST MRA HEAD WITHOUT CONTRAST MRA OF THE NECK WITHOUT AND WITH CONTRAST TECHNIQUE: Multiplanar, multi-echo pulse sequences of the brain and surrounding structures were acquired without intravenous contrast. Angiographic images of the Circle of Willis were acquired using MRA technique without intravenous contrast. Angiographic images of the neck were acquired using MRA technique without and with intravenous contrast. Carotid stenosis measurements (when applicable) are obtained utilizing NASCET criteria, using the distal internal carotid diameter as the denominator. CONTRAST:  9.59mL GADAVIST GADOBUTROL 1 MMOL/ML IV SOLN COMPARISON:  No pertinent prior exam. FINDINGS: MR HEAD FINDINGS Brain: There is hyperintensity diffusion-weighted imaging the paramedian superior left frontal lobe. No acute or chronic hemorrhage. There is multifocal hyperintense T2-weighted signal within the white matter. Generalized volume loss without a clear lobar predilection. The midline structures are normal. Vascular:  Major flow voids are preserved. Skull and upper cervical spine: Normal calvarium and skull base. Visualized upper cervical spine and soft tissues are normal. Sinuses/Orbits:No paranasal sinus fluid levels or advanced mucosal thickening. No mastoid or middle ear effusion. Normal orbits. MRA HEAD FINDINGS POSTERIOR CIRCULATION: --Vertebral arteries: Normal --Inferior cerebellar arteries: Normal. --Basilar artery: Normal. --Superior cerebellar arteries: Normal. --Posterior cerebral arteries: Normal. ANTERIOR CIRCULATION: --Intracranial internal carotid arteries: Normal. --Anterior cerebral arteries (ACA): Normal. --Middle cerebral arteries (MCA): Normal.  ANATOMIC VARIANTS: None MRA NECK FINDINGS Aortic arch: Standard branching pattern Right carotid system: Normal Left carotid system: Normal Vertebral arteries: Normal left dominant system. Other: None. IMPRESSION: 1. Small focus of acute ischemia within the paramedian superior left frontal lobe. No hemorrhage or mass effect. 2. Normal MRA of the head and neck. 3. 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 Electronically Signed   By: Deatra Robinson M.D.   On: 02/05/2021 20:58   MR BRAIN WO CONTRAST  Result Date: 02/05/2021 CLINICAL DATA:  Neuro deficit, acute, stroke suspected EXAM: MRI HEAD WITHOUT CONTRAST MRA HEAD WITHOUT CONTRAST MRA OF THE NECK WITHOUT AND WITH CONTRAST TECHNIQUE: Multiplanar, multi-echo pulse sequences of the brain and surrounding structures were acquired without intravenous contrast. Angiographic images of the Circle of Willis were acquired using MRA technique without intravenous contrast. Angiographic images of the neck were acquired using MRA technique without and with intravenous contrast. Carotid stenosis measurements (when applicable) are obtained utilizing NASCET criteria, using the distal internal carotid diameter as the denominator. CONTRAST:  9.38mL GADAVIST GADOBUTROL 1 MMOL/ML IV SOLN COMPARISON:  No pertinent prior exam. FINDINGS: MR HEAD FINDINGS Brain: There is hyperintensity diffusion-weighted imaging the paramedian superior left frontal lobe. No acute or chronic hemorrhage. There is multifocal hyperintense T2-weighted signal within the white matter. Generalized volume loss without a clear lobar predilection. The midline structures are normal. Vascular: Major flow voids are preserved. Skull and upper cervical spine: Normal calvarium and skull base. Visualized upper cervical spine and soft tissues are normal. Sinuses/Orbits:No paranasal sinus fluid levels or advanced mucosal thickening. No mastoid or middle ear effusion. Normal orbits. MRA HEAD FINDINGS POSTERIOR  CIRCULATION: --Vertebral arteries: Normal --Inferior cerebellar arteries: Normal. --Basilar artery: Normal. --Superior cerebellar arteries: Normal. --Posterior cerebral arteries: Normal. ANTERIOR CIRCULATION: --Intracranial internal carotid arteries: Normal. --Anterior cerebral arteries (ACA): Normal. --Middle cerebral arteries (MCA): Normal. ANATOMIC VARIANTS: None MRA NECK FINDINGS Aortic arch: Standard branching pattern Right carotid system: Normal Left carotid system: Normal Vertebral arteries: Normal left dominant system. Other: None. IMPRESSION: 1. Small focus of acute ischemia within the paramedian superior left frontal lobe. No hemorrhage or mass effect. 2. Normal MRA of the head and neck. 3. 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 Electronically Signed   By: Deatra Robinson M.D.   On: 02/05/2021 20:58   CT HEAD CODE STROKE WO CONTRAST  Result Date: 02/05/2021 CLINICAL DATA:  Code stroke.  Neuro deficit, acute stroke suspected. EXAM: CT HEAD WITHOUT CONTRAST TECHNIQUE: Contiguous axial images were obtained from the base of the skull through the vertex without intravenous contrast. COMPARISON:  None. FINDINGS: Brain: Hypodensity within the paramidline left cerebellar hemisphere, compatible with the age indeterminate lacunar infarct. Vascular: No hyperdense vessel identified. Calcific atherosclerosis. Skull: No acute fracture. Sinuses/Orbits: Clear sinuses. Other: No mastoid effusions. ASPECTS [facility] Stroke Program Early CT Score) Total score (0-10 with 10 being normal): 10. IMPRESSION: 1. 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 An MRI could better evaluate if clinically indicated. 2. No evidence of acute  large vascular territory infarct or acute hemorrhage. ASPECTS is 10. Code stroke imaging results were communicated on 02/05/2021 at 5:12 pm to provider Dr. Selina CooleyStack via secure text paging. Electronically Signed   By: Feliberto HartsFrederick S Jones MD   On: 02/05/2021 17:13     Procedures Procedures   Medications Ordered in ED Medications  atorvastatin (LIPITOR) tablet 80 mg (80 mg Oral Given 02/05/21 1806)  aspirin chewable tablet 81 mg (81 mg Oral Given 02/05/21 1806)   stroke: mapping our early stages of recovery book (has no administration in time range)  acetaminophen (TYLENOL) tablet 650 mg (has no administration in time range)  senna-docusate (Senokot-S) tablet 1 tablet (has no administration in time range)  enoxaparin (LOVENOX) injection 40 mg (has no administration in time range)  meclizine (ANTIVERT) tablet 12.5 mg (has no administration in time range)  hydrALAZINE (APRESOLINE) tablet 25 mg (has no administration in time range)  gadobutrol (GADAVIST) 1 MMOL/ML injection 9.5 mL (9.5 mLs Intravenous Contrast Given 02/05/21 2032)    ED Course  I have reviewed the triage vital signs and the nursing notes.  Pertinent labs & imaging results that were available during my care of the patient were reviewed by me and considered in my medical decision making (see chart for details).    MDM Rules/Calculators/A&P                          patient presents the emergency department for right lower extremity weakness. He does have weakness on evaluation in a code stroke was activated. His weakness did rapidly improve. He was deemed not to be a TPA candidate. He was evaluated by neurology team shortly after his initial ED assessment. CT head was negative but MRI did demonstrate an acute infarct. Medicine consulted for admission for ongoing treatment.  Final Clinical Impression(s) / ED Diagnoses Final diagnoses:  Acute CVA (cerebrovascular accident) [facility](HCC)    Rx / DC Orders ED Discharge Orders     None        Tilden Fossaees, Hagar Sadiq, MD 02/05/21 2306

## 2021-02-05 NOTE — ED Notes (Signed)
Patient transported to MRI 

## 2021-02-05 NOTE — ED Notes (Signed)
Code Stroke activated by Lupe Carney PA

## 2021-02-05 NOTE — Code Documentation (Signed)
Stroke Response Nurse Documentation Code Documentation  Thomas Roberts is a 75 y.o. male arriving to [facility] ED via Private Vehicle on 02/05/2021. Code stroke was activated by ED. Patient from home where he was at the grocery store check out line when he suddenly had symptoms.  LKW at 1530 and now complaining of Right leg weakness, dizziness and uncoordinated. On No antithrombotic. Stroke team at the bedside on patient arrival. Labs drawn and patient cleared for CT by Rebekah PA. Patient to CT with team. NIHSS 0, see documentation for details and code stroke times. The following imaging was completed:  CT. Patient is not a candidate for tPA due to resolution of symptoms Care/Plan. Bedside handoff with ED RN Seward Grater.  Neuro checks and VS q 2 hours.  RN to call neurologist/Code Stroke if he has symptoms.   Marcellina Millin  Stroke Response RN

## 2021-02-05 NOTE — ED Notes (Signed)
Neuro notified that pt able to hold right leg up but unable to flex foot

## 2021-02-05 NOTE — H&P (Signed)
History and Physical    Armin Shimkus BDZ:[id number] DOB: [birth year] DOA: 02/05/2021  PCP: Pcp, No (Confirm with patient/family/NH records and if not entered, this has to be entered at [facility] point of entry) Patient coming from: Home  I have personally briefly reviewed patient's old medical records in [facility]  Chief Complaint: Right leg weakness, feeling dizzy  HPI: Thomas Roberts is a 75 y.o. male with no significant past medical history, presented with sudden onset of right flank weakness and feeling dizzy.  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  ED Course: Right leg weakness symptom significantly improved in the ED.  Continue to be on non-tPA protocol.  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  Review of Systems: As per HPI otherwise 14 point review of systems negative.    History reviewed. No pertinent past medical history.  History reviewed. No pertinent surgical history.   reports that he has never smoked. He has never used smokeless tobacco. He reports previous alcohol use. He reports previous drug use.  No Known Allergies  History reviewed. No pertinent family history.   Prior to Admission medications   Not on File    Physical Exam: Vitals:   02/05/21 1845 02/05/21 1900 02/05/21 2104 02/05/21 2115  BP: (!) 152/99 (!) 150/101 (!) 145/91 (!) 152/91  Pulse: 73 75 75 76  Resp: 13 13 12 12   Temp:      TempSrc:      SpO2: 99% 97% 99% 98%  Weight:      Height:        Constitutional: NAD, calm, comfortable Vitals:   02/05/21 1845 02/05/21 1900 02/05/21 2104 02/05/21 2115  BP: (!) 152/99 (!) 150/101 (!) 145/91 (!) 152/91  Pulse: 73 75 75 76  Resp: 13 13 12 12   Temp:       TempSrc:      SpO2: 99% 97% 99% 98%  Weight:      Height:       Eyes: PERRL, lids and conjunctivae normal ENMT: Mucous membranes are moist. Posterior pharynx clear of any exudate or lesions.Normal dentition.  Neck: normal, supple, no masses, no thyromegaly Respiratory: clear to auscultation bilaterally, no wheezing, no crackles. Normal respiratory effort. No accessory muscle use.  Cardiovascular: Regular rate and rhythm, no murmurs / rubs / gallops. No extremity edema. 2+ pedal pulses. No carotid bruits.  Abdomen: no tenderness, no masses palpated. No hepatosplenomegaly. Bowel sounds positive.  Musculoskeletal: no clubbing / cyanosis. No joint deformity upper and lower extremities. Good ROM, no contractures. Normal muscle tone.  Skin: no rashes, lesions, ulcers. No induration Neurologic: No facial droops, slight weakness of the right proximal flexion compared to 5/5 on the left side, sensation intact. Psychiatric: Normal judgment and insight. Alert and oriented x 3. Normal mood.     Labs on Admission: I have personally reviewed following labs and imaging studies  CBC: Recent Labs  Lab 02/05/21 1702 02/05/21 1709  WBC 6.2  --   NEUTROABS 3.3  --   HGB 16.7 16.7  HCT 50.5 49.0  MCV 90.0  --   PLT 213  --    Basic Metabolic Panel: Recent Labs  Lab 02/05/21 1702 02/05/21 1709  NA 142 140  K 4.5 5.9*  CL 108 109  CO2 26  --   GLUCOSE 95 93  BUN 27* 44*  CREATININE 1.35* 1.30*  CALCIUM 9.6  --    GFR: Estimated Creatinine Clearance: 57.2 mL/min (A) (by C-G formula based on SCr of 1.3 mg/dL (H)). Liver Function Tests: Recent Labs  Lab 02/05/21 1702  AST 22  ALT 25  ALKPHOS 100  BILITOT 0.6  PROT 6.6  ALBUMIN 4.1   No results for input(s): LIPASE, AMYLASE in the last 168 hours. No results for input(s): AMMONIA in the last 168 hours. Coagulation Profile: Recent Labs  Lab 02/05/21 1702  INR 1.1   Cardiac Enzymes: No results for input(s): CKTOTAL, CKMB,  CKMBINDEX, TROPONINI in the last 168 hours. BNP (last 3 results) No results for input(s): PROBNP in the last 8760 hours. HbA1C: No results for input(s): HGBA1C in the last 72 hours. CBG: Recent Labs  Lab 02/05/21 1700  GLUCAP 87   Lipid Profile: Recent Labs    02/05/21 1809  LDLDIRECT 86.5   Thyroid Function Tests: No results for input(s): TSH, T4TOTAL, FREET4, T3FREE, THYROIDAB in the last 72 hours. Anemia Panel: No results for input(s): VITAMINB12, FOLATE, FERRITIN, TIBC, IRON, RETICCTPCT in the last 72 hours. Urine analysis:    Component Value Date/Time   COLORURINE YELLOW 02/05/2021 1809   APPEARANCEUR CLEAR 02/05/2021 1809   LABSPEC 1.026 02/05/2021 1809   PHURINE 5.0 02/05/2021 1809   GLUCOSEU NEGATIVE 02/05/2021 1809   HGBUR NEGATIVE 02/05/2021 1809   BILIRUBINUR NEGATIVE 02/05/2021 1809   KETONESUR NEGATIVE 02/05/2021 1809   PROTEINUR NEGATIVE 02/05/2021 1809   NITRITE NEGATIVE 02/05/2021 1809   LEUKOCYTESUR NEGATIVE 02/05/2021 1809    Radiological Exams on Admission: MR ANGIO HEAD WO CONTRAST  Result Date: 02/05/2021 CLINICAL DATA:  Neuro deficit, acute, stroke suspected EXAM: MRI HEAD WITHOUT CONTRAST MRA HEAD WITHOUT CONTRAST MRA OF THE NECK WITHOUT AND WITH CONTRAST TECHNIQUE: Multiplanar, multi-echo pulse sequences of the brain and surrounding structures were acquired without intravenous contrast. Angiographic images of the Circle of Willis were acquired using MRA technique without intravenous contrast. Angiographic images of the neck were acquired using MRA technique without and with intravenous contrast. Carotid stenosis measurements (when applicable) are obtained utilizing NASCET criteria, using the distal internal carotid diameter as the denominator. CONTRAST:  9.74mL GADAVIST GADOBUTROL 1 MMOL/ML IV SOLN COMPARISON:  No pertinent prior exam. FINDINGS: MR HEAD FINDINGS Brain: There is hyperintensity diffusion-weighted imaging the paramedian superior left  frontal lobe. No acute or chronic hemorrhage. There is multifocal hyperintense T2-weighted signal within the white matter. Generalized volume loss without a clear lobar predilection. The midline structures are normal. Vascular: Major flow voids are preserved. Skull and upper cervical spine: Normal calvarium and skull base. Visualized upper cervical spine and soft tissues are normal. Sinuses/Orbits:No paranasal sinus fluid levels or advanced mucosal thickening. No mastoid or middle ear effusion. Normal orbits. MRA HEAD FINDINGS POSTERIOR CIRCULATION: --Vertebral arteries: Normal --Inferior cerebellar arteries: Normal. --Basilar artery: Normal. --Superior cerebellar arteries: Normal. --Posterior cerebral arteries: Normal. ANTERIOR CIRCULATION: --Intracranial internal carotid arteries: Normal. --Anterior cerebral arteries (ACA): Normal. --Middle cerebral arteries (MCA): Normal. ANATOMIC VARIANTS: None MRA NECK FINDINGS Aortic arch: Standard branching pattern Right carotid system: Normal Left carotid system: Normal Vertebral arteries: Normal left dominant system. Other: None. IMPRESSION: 1. Small focus of acute ischemia within the paramedian superior left frontal lobe. No hemorrhage or mass effect. 2. Normal MRA of the head and neck. 3. 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 Electronically Signed   By:  Deatra RobinsonKevin  Herman M.D.   On: 02/05/2021 20:58   MR ANGIO NECK W WO CONTRAST  Result Date: 02/05/2021 CLINICAL DATA:  Neuro deficit, acute, stroke suspected EXAM: MRI HEAD WITHOUT CONTRAST MRA HEAD WITHOUT CONTRAST MRA OF THE NECK WITHOUT AND WITH CONTRAST TECHNIQUE: Multiplanar, multi-echo pulse sequences of the brain and surrounding structures were acquired without intravenous contrast. Angiographic images of the Circle of Willis were acquired using MRA technique without intravenous contrast. Angiographic images of the neck were acquired using MRA technique without and with intravenous contrast. Carotid  stenosis measurements (when applicable) are obtained utilizing NASCET criteria, using the distal internal carotid diameter as the denominator. CONTRAST:  9.755mL GADAVIST GADOBUTROL 1 MMOL/ML IV SOLN COMPARISON:  No pertinent prior exam. FINDINGS: MR HEAD FINDINGS Brain: There is hyperintensity diffusion-weighted imaging the paramedian superior left frontal lobe. No acute or chronic hemorrhage. There is multifocal hyperintense T2-weighted signal within the white matter. Generalized volume loss without a clear lobar predilection. The midline structures are normal. Vascular: Major flow voids are preserved. Skull and upper cervical spine: Normal calvarium and skull base. Visualized upper cervical spine and soft tissues are normal. Sinuses/Orbits:No paranasal sinus fluid levels or advanced mucosal thickening. No mastoid or middle ear effusion. Normal orbits. MRA HEAD FINDINGS POSTERIOR CIRCULATION: --Vertebral arteries: Normal --Inferior cerebellar arteries: Normal. --Basilar artery: Normal. --Superior cerebellar arteries: Normal. --Posterior cerebral arteries: Normal. ANTERIOR CIRCULATION: --Intracranial internal carotid arteries: Normal. --Anterior cerebral arteries (ACA): Normal. --Middle cerebral arteries (MCA): Normal. ANATOMIC VARIANTS: None MRA NECK FINDINGS Aortic arch: Standard branching pattern Right carotid system: Normal Left carotid system: Normal Vertebral arteries: Normal left dominant system. Other: None. IMPRESSION: 1. Small focus of acute ischemia within the paramedian superior left frontal lobe. No hemorrhage or mass effect. 2. Normal MRA of the head and neck. 3. 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 Electronically Signed   By: Deatra RobinsonKevin  Herman M.D.   On: 02/05/2021 20:58   MR BRAIN WO CONTRAST  Result Date: 02/05/2021 CLINICAL DATA:  Neuro deficit, acute, stroke suspected EXAM: MRI HEAD WITHOUT CONTRAST MRA HEAD WITHOUT CONTRAST MRA OF THE NECK WITHOUT AND WITH CONTRAST TECHNIQUE:  Multiplanar, multi-echo pulse sequences of the brain and surrounding structures were acquired without intravenous contrast. Angiographic images of the Circle of Willis were acquired using MRA technique without intravenous contrast. Angiographic images of the neck were acquired using MRA technique without and with intravenous contrast. Carotid stenosis measurements (when applicable) are obtained utilizing NASCET criteria, using the distal internal carotid diameter as the denominator. CONTRAST:  9.645mL GADAVIST GADOBUTROL 1 MMOL/ML IV SOLN COMPARISON:  No pertinent prior exam. FINDINGS: MR HEAD FINDINGS Brain: There is hyperintensity diffusion-weighted imaging the paramedian superior left frontal lobe. No acute or chronic hemorrhage. There is multifocal hyperintense T2-weighted signal within the white matter. Generalized volume loss without a clear lobar predilection. The midline structures are normal. Vascular: Major flow voids are preserved. Skull and upper cervical spine: Normal calvarium and skull base. Visualized upper cervical spine and soft tissues are normal. Sinuses/Orbits:No paranasal sinus fluid levels or advanced mucosal thickening. No mastoid or middle ear effusion. Normal orbits. MRA HEAD FINDINGS POSTERIOR CIRCULATION: --Vertebral arteries: Normal --Inferior cerebellar arteries: Normal. --Basilar artery: Normal. --Superior cerebellar arteries: Normal. --Posterior cerebral arteries: Normal. ANTERIOR CIRCULATION: --Intracranial internal carotid arteries: Normal. --Anterior cerebral arteries (ACA): Normal. --Middle cerebral arteries (MCA): Normal. ANATOMIC VARIANTS: None MRA NECK FINDINGS Aortic arch: Standard branching pattern Right carotid system: Normal Left carotid system: Normal Vertebral arteries: Normal left dominant system. Other: None. IMPRESSION:  1. Small focus of acute ischemia within the paramedian superior left frontal lobe. No hemorrhage or mass effect. 2. Normal MRA of the head and neck. 3.  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 Electronically Signed   By: Deatra Robinson M.D.   On: 02/05/2021 20:58   CT HEAD CODE STROKE WO CONTRAST  Result Date: 02/05/2021 CLINICAL DATA:  Code stroke.  Neuro deficit, acute stroke suspected. EXAM: CT HEAD WITHOUT CONTRAST TECHNIQUE: Contiguous axial images were obtained from the base of the skull through the vertex without intravenous contrast. COMPARISON:  None. FINDINGS: Brain: Hypodensity within the paramidline left cerebellar hemisphere, compatible with the age indeterminate lacunar infarct. Vascular: No hyperdense vessel identified. Calcific atherosclerosis. Skull: No acute fracture. Sinuses/Orbits: Clear sinuses. Other: No mastoid effusions. ASPECTS [facility] Stroke Program Early CT Score) Total score (0-10 with 10 being normal): 10. IMPRESSION: 1. 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 An MRI could better evaluate if clinically indicated. 2. No evidence of acute large vascular territory infarct or acute hemorrhage. ASPECTS is 10. Code stroke imaging results were communicated on 02/05/2021 at 5:12 pm to provider Dr. Selina Cooley via secure text paging. Electronically Signed   By: Feliberto Harts MD   On: 02/05/2021 17:13    EKG: Independently reviewed.  Sinus, no PR or QTc interval issue  Assessment/Plan Active Problems:   CVA (cerebral vascular accident) (HCC)  (please populate well all problems here in Problem List. (For example, if patient is on BP meds at home and you resume or decide to hold them, it is a problem that needs to be her. 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 -Risk factor modification : Lipid panel and A1c.  Telemetry monitoring x24 hours, echocardiogram. -PT evaluation. -ASA  Elevated blood pressure without diagnosis of HTN -No history of HTN, recent physical therapy  at PCP office blood pressure 126/76.  Today's high reading probably related to stress associated with CVA.  Will recheck. -As needed hydralazine for blood pressure 200/110.  CKD stage I -Creatinine 1.3 today compared to creatinine=1.2 last year.  Euvolemic.  DVT prophylaxis: Lovenox Code Status: Full code Family Communication: None at bedside Disposition Plan: Expect less than 2 midnight hospital stay Consults called: Neurology Admission status: Tele obs   Emeline General MD [facility] Pager [phone]  02/05/2021, 10:13 PM

## 2021-02-05 NOTE — ED Triage Notes (Signed)
Pt here for sudden dizziness at 1530 while standing in line at the grocery store. Dizziness has resolved but feels his R leg is weaker than normal, states he felt uncoordinated while trying to walk.

## 2021-02-05 NOTE — Consult Note (Signed)
NEUROLOGY CONSULTATION NOTE   Date of service: February 05, 2021 Patient Name: Thomas Roberts MRN:  [id number] DOB:  November 13, 1945 Reason for consult: stroke code for RLE weakness and incoordination Requesting physician: Lincoln Brigham MD _ _ _   _ __   _ __ _ _  __ __   _ __   __ _  History of Present Illness   This is a 75 year old gentleman with no significant past medical history he takes no medications presented to the emergency department for right lower extremity weakness and right lower extremity incoordination with lightheadedness that started acutely at 1545 this afternoon while walking in the grocery store.  No known history of stroke for patient.  In triage she was noted to have significant right lower extremity weakness and impaired coordination in that extremity preventing him from being able to stand.  Stroke code was activated.  CT head showed probable small chronic infarct in the left cerebellum.  No acute findings.  Exam rapidly improved and on my examination after the Noncon head CT was completed he had no focal deficits.  Stroke scale was 0 at that time.  CTA was not performed due to exam not being consistent with LVO.   ROS   Per HPI; all other systems reviewed and were negative,  Past History   History reviewed. No pertinent past medical history. History reviewed. No pertinent surgical history. History reviewed. No pertinent family history. Social History   Socioeconomic History   Marital status: Married    Spouse name: Not on file   Number of children: Not on file   Years of education: Not on file   Highest education level: Not on file  Occupational History   Not on file  Tobacco Use   Smoking status: Never   Smokeless tobacco: Never  Substance and Sexual Activity   Alcohol use: Not Currently   Drug use: Not Currently   Sexual activity: Not on file  Other Topics Concern   Not on file  Social History Narrative   Not on file   Social Determinants of Health    Financial Resource Strain: Not on file  Food Insecurity: Not on file  Transportation Needs: Not on file  Physical Activity: Not on file  Stress: Not on file  Social Connections: Not on file   No Known Allergies  Medications   Does not take any medications as o/p    Vitals   Vitals:   02/05/21 1629 02/05/21 1700 02/05/21 1722 02/05/21 1730  BP:   (!) 141/95 (!) 150/99  Pulse:   72 68  Resp:   11 16  Temp:      TempSrc:      SpO2:   100% 99%  Weight: 92.1 kg 93.6 kg    Height: 6\' 1"  (1.854 m)        Body mass index is 27.22 kg/m.  Physical Exam   Physical Exam Gen: A&O x4, NAD HEENT: Atraumatic, normocephalic;mucous membranes moist; oropharynx clear, tongue without atrophy or fasciculations. Neck: Supple, trachea midline. Resp: CTAB, no w/r/r CV: RRR, no m/g/r; nml S1 and S2. 2+ symmetric peripheral pulses. Abd: soft/NT/ND; nabs x 4 quad Extrem: Nml bulk; no cyanosis, clubbing, or edema.  Neuro: *MS: A&O x4. Follows multi-step commands.  *Speech: fluid, nondysarthric, able to name and repeat *CN:    I: Deferred   II,III: PERRLA, VFF by confrontation, optic discs sharp   III,IV,VI: EOMI w/o nystagmus, no ptosis   V: Sensation intact from V1 to  V3 to LT   VII: Eyelid closure was full.  Smile symmetric   VIII: Hearing intact to voice   IX,X: Voice normal, palate elevates symmetrically    XI: SCM/trap 5/5 bilat   XII: Tongue protrudes midline, no atrophy or fasciculations   *Motor:   Normal bulk.  No tremor, rigidity or bradykinesia. No pronator drift.    Strength: Dlt Bic Tri WrE WrF FgS Gr HF KnF KnE PlF DoF    Left 5 5 5 5 5 5 5 5 5 5 5 5     Right 5 5 5 5 5 5 5 5 5 5 5 5     *Sensory: Intact to light touch, pinprick, temperature vibration throughout. Symmetric. Propioception intact bilat.  No double-simultaneous extinction.  *Coordination:  Finger-to-nose, heel-to-shin, rapid alternating motions were intact. *Reflexes:  2+ and symmetric throughout  without clonus; toes down-going bilat *Gait: normal base, normal stride, normal turn.   NIHSS = 0   Premorbid mRS = 0   Labs   CBC:  Recent Labs  Lab 02/05/21 1709  HGB 16.7  HCT 49.0    Basic Metabolic Panel:  Lab Results  Component Value Date   NA 140 02/05/2021   K 5.9 (H) 02/05/2021   GLUCOSE 93 02/05/2021   BUN 44 (H) 02/05/2021   CREATININE 1.30 (H) 02/05/2021   Lipid Panel: No results found for: LDLCALC HgbA1c: No results found for: HGBA1C Urine Drug Screen: No results found for: LABOPIA, COCAINSCRNUR, LABBENZ, AMPHETMU, THCU, LABBARB  Alcohol Level No results found for: ETH   Impression   This is a 75 year old gentleman with no significant past medical history he takes no medications presented to the emergency department for right lower extremity weakness and right lower extremity incoordination with lightheadedness that started acutely at 1545 this afternoon. CT head NAICP. Sx rapidly improved and stroke scale is currently zero.  Recommendations   - Patient should be kept in ED until outside of the window for tPA @ 2000 under increased monitoring protocol vitals q15 min and NIHSS q 30 min. If patient's exam deteriorates re-activate stroke code. - If no exam change by 2000 admit to hospitalist service for stroke w/u; stroke team will consult - Permissive HTN x48 hrs from sx onset or until stroke ruled out by MRI goal BP <220/110. PRN labetalol or hydralazine if BP above these parameters. Avoid oral antihypertensives. - MRI brain wo contrast - MRA H&N - TTE w/ bubble - Check A1c and LDL + add statin per guidelines - ASA 325mg  now f/b 81mg  daily. Consider adding plavix based on imaging results. - q4 hr neuro checks - STAT head CT for any change in neuro exam - Tele - PT/OT/SLP - Stroke education - Amb referral to neurology upon discharge   Stroke team will continue to  follow.   ______________________________________________________________________   Thank you for the opportunity to take part in the care of this patient. If you have any further questions, please contact the neurology consultation attending.  Signed,  02/07/2021, MD [facility] [phone]  If 7pm- 7am, please page neurology on call as listed in AMION.

## 2021-02-06 ENCOUNTER — Observation Stay (HOSPITAL_BASED_OUTPATIENT_CLINIC_OR_DEPARTMENT_OTHER): Payer: 59

## 2021-02-06 ENCOUNTER — Other Ambulatory Visit (HOSPITAL_COMMUNITY): Payer: Self-pay

## 2021-02-06 DIAGNOSIS — R42 Dizziness and giddiness: Secondary | ICD-10-CM | POA: Diagnosis not present

## 2021-02-06 DIAGNOSIS — I639 Cerebral infarction, unspecified: Secondary | ICD-10-CM | POA: Diagnosis not present

## 2021-02-06 DIAGNOSIS — I6389 Other cerebral infarction: Secondary | ICD-10-CM | POA: Diagnosis not present

## 2021-02-06 LAB — POTASSIUM: Potassium: 4.1 mmol/L (ref 3.5–5.1)

## 2021-02-06 LAB — ECHOCARDIOGRAM COMPLETE
Area-P 1/2: 2.29 cm2
Height: 73 in
P 1/2 time: 629 msec
S' Lateral: 3.4 cm
Weight: 3301.61 oz

## 2021-02-06 LAB — LIPID PANEL
Cholesterol: 147 mg/dL (ref 0–200)
HDL: 56 mg/dL (ref >40)
LDL Cholesterol: 67 mg/dL (ref 0–99)
Total CHOL/HDL Ratio: 2.6 RATIO
Triglycerides: 120 mg/dL (ref <150)
VLDL: 24 mg/dL (ref 0–40)

## 2021-02-06 LAB — HEMOGLOBIN A1C
Hgb A1c MFr Bld: 5.3 % (ref 4.8–5.6)
Mean Plasma Glucose: 105.41 mg/dL

## 2021-02-06 MED ORDER — CLOPIDOGREL BISULFATE 75 MG PO TABS
75.0000 mg | ORAL_TABLET | Freq: Every day | ORAL | 0 refills | Status: AC
  Filled 2021-02-06: qty 21, 21d supply, fill #0

## 2021-02-06 MED ORDER — ASPIRIN 81 MG PO CHEW
81.0000 mg | CHEWABLE_TABLET | Freq: Every day | ORAL | 2 refills | Status: AC
  Filled 2021-02-06: qty 30, 30d supply, fill #0

## 2021-02-06 NOTE — Progress Notes (Addendum)
STROKE TEAM PROGRESS NOTE   INTERVAL HISTORY No significant overnight events. He feels that symptoms have resolved. Discussed imaging results and concern for embolic source. He denies palpitations or history of arrhythmias.   Vitals:   02/06/21 0945 02/06/21 0955 02/06/21 1000 02/06/21 1100  BP: (!) 142/95  (!) 146/85 (!) 144/97  Pulse: 73  (!) 57 (!) 57  Resp: 15  14 11   Temp:  97.7 F (36.5 C)    TempSrc:  Oral    SpO2: 98%  98% 97%  Weight:      Height:       CBC:  Recent Labs  Lab 02/05/21 1702 02/05/21 1709  WBC 6.2  --   NEUTROABS 3.3  --   HGB 16.7 16.7  HCT 50.5 49.0  MCV 90.0  --   PLT 213  --    Basic Metabolic Panel:  Recent Labs  Lab 02/05/21 1702 02/05/21 1709 02/06/21 0840  NA 142 140  --   K 4.5 5.9* 4.1  CL 108 109  --   CO2 26  --   --   GLUCOSE 95 93  --   BUN 27* 44*  --   CREATININE 1.35* 1.30*  --   CALCIUM 9.6  --   --    Lipid Panel:  Recent Labs  Lab 02/06/21 0339  CHOL 147  TRIG 120  HDL 56  CHOLHDL 2.6  VLDL 24  LDLCALC 67   HgbA1c:  Recent Labs  Lab 02/06/21 0339  HGBA1C 5.3   Urine Drug Screen:  Recent Labs  Lab 02/05/21 1809  LABOPIA NONE DETECTED  COCAINSCRNUR NONE DETECTED  LABBENZ NONE DETECTED  AMPHETMU NONE DETECTED  THCU NONE DETECTED  LABBARB NONE DETECTED    Alcohol Level  Recent Labs  Lab 02/05/21 1702  ETH <10   CT head w/o: no acute findings. ASPECTS 10  MRI brain: small focus of acute ischemia within the left frontal lobe.   MRA head/neck: no LVO, mild chronic small vessel disease   PHYSICAL EXAM General: well appearing elderly Caucasian male male in no acute distress Cardiac: RRR on tele monitor Neurological Exam : :  Mental status: alert and oriented. X 3.  Speech: normal articulation and speech pattern. No dysarthria or aphasia Cranial nerves: EOM intact. Peripheral vision intact. No facial droop. Hearing intact. Voice normal. Tongue midline.  Motor: 5/5 strength in bilateral upper  and lower extremities. Normal muscle tone and bulk.  Normal rapid alternating motion.  Gait deferred  ASSESSMENT/PLAN Thomas Roberts is a 75 y.o. male with no significant medical problems that presented to [facility] ED on 02/05/21 for acute onset right upper and lower extremity weakness and was found to have a small left front lobe infarct. NIHHS 0.   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  Imaging findings concerning for embolic source. No known history of atrial fibrillation. No afib on telemetry since admission.  CT head w/o: no acute findings. ASPECTS 10 MRI brain: small focus of acute ischemia within the left frontal lobe.  MRA head/neck: no LVO, mild chronic small vessel disease 2D Echo ejection fraction 60 to 55%.  No wall motion abnormalities.  LDL 67 HgbA1c 5.3 Recommend loop recorder placement prior to discharge.  If not possible today it can be done as an outpatient as per cardiology discretion.  DAPT with plavix and aspirin for 21 days followed by aspirin alone  Therapy recommendations:  none Disposition: Home Elevated blood pressure readings.  Blood pressure has been elevated since admission. No known history of formal hypertension diagnosis.   On review of records from care everywhere, it appears that he has been normotensive at prior office visits so would not act on the elevated pressure readings just from this admission. He can continue to follow with his PCP for ongoing monitoring after discharge.   Other Stroke Risk Factors Advanced Age >/= 20    Hospital day # 0  Elige Radon, MD Internal Medicine Resident PGY-3 [facility] Pager: [phone] 02/06/2021 12:49 PM    Stroke MD note:  I have personally obtained history,examined this patient, reviewed notes, independently viewed imaging studies, participated in medical decision making and plan of care.ROS completed by me personally and pertinent positives fully  documented  I have made any additions or clarifications directly to the above note. Agree with note above.  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  His neurological deficits have nearly resolved completely.  Recommend loop recorder at discharge to look for paroxysmal A. fib.  Aspirin and Plavix for 3 weeks followed by aspirin alone and aggressive risk factor modification.  Discussed with patient and Dr. Caleb Popp.  Greater than 50% time during this 35-minute visit was spent in counseling and coordination of care about his embolic stroke and discussion about evaluation and treatment and answering questions.  Follow-up as an outpatient stroke clinic in 6 to 8 weeks.  Stroke team will sign off.  Kindly call for questions.  Delia Heady, MD Medical Director [facility] Pager: [phone] 02/06/2021 4:15 PM   To contact Stroke Continuity provider, please refer to [email or web address]. After hours, contact General Neurology

## 2021-02-06 NOTE — Progress Notes (Signed)
  Echocardiogram 2D Echocardiogram has been performed.  Thomas Roberts 02/06/2021, 10:45 AM

## 2021-02-06 NOTE — Discharge Summary (Signed)
Physician Discharge Summary  Waleed Obey YNW:[id number] DOB: [birth year] DOA: 02/05/2021  PCP: Pcp, No  Admit date: 02/05/2021 Discharge date: 02/06/2021  Admitted From: Home Disposition: Home  Recommendations for Outpatient Follow-up:  Follow up with PCP in 1 week Follow up with neurology in 4 weeks Follow up with cardiology/EP for loop recorder on August 8th Please follow up on the following pending results: None  Home Health: None Equipment/Devices: None  Discharge Condition: Stable CODE STATUS: Full code Diet recommendation: Heart healthy   Brief/Interim Summary:  Admission HPI written by Emeline General, MD  HPI: Thomas Roberts is a 75 y.o. male with no significant past medical history, presented with sudden onset of right flank weakness and feeling dizzy.   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  Hospital course:  Left frontal lobe infarct Neurology consulted. Likely cryptogenic in etiology with findings concerning for embolic source. No arrhythmia noted on telemetry. MRA head/neck with chronic small vessel disease. Transthoracic Echocardiogram without wall motion abnormalities. LDL of 67 and hemoglobin A1C of 5.3%. Neurology recommendation for DAPT for 21 days with aspirin 81 mg and Plavix 75 mg, followed by aspirin alone. No statin recommended secondary to at goal LDL. Loop recorder recommended and this will be set up as an outpatient.  Elevated blood pressure Improved prior to discharge.  Aortic root dilation Mild. Will need follow-up as an outpatient. Can follow-up with PCP.  CKD stage I Stable.  Discharge Diagnoses:  Active Problems:   Acute CVA (cerebrovascular accident) [facility])    Discharge  Instructions   Allergies as of 02/06/2021   No Known Allergies      Medication List     TAKE these medications    aspirin 81 MG chewable tablet Chew 1 tablet (81 mg total) by mouth daily. Start taking on: February 07, 2021   clopidogrel 75 MG tablet Commonly known as: Plavix Take 1 tablet (75 mg total) by mouth daily for 21 days.        Follow-up Information     Micki Riley, MD. Schedule an appointment as soon as possible for a visit in 4 week(s).   Specialties: Neurology, Radiology Why: For hospital follow-up Contact information: [address] [place] [address] [phone]                No Known Allergies  Consultations: Neurology   Procedures/Studies: MR ANGIO HEAD WO CONTRAST  Result Date: 02/05/2021 CLINICAL DATA:  Neuro deficit, acute, stroke suspected EXAM: MRI HEAD WITHOUT CONTRAST MRA HEAD WITHOUT CONTRAST MRA OF THE NECK WITHOUT AND WITH CONTRAST TECHNIQUE: Multiplanar, multi-echo pulse sequences of the brain and surrounding structures were acquired without intravenous contrast. Angiographic images of the Circle of Willis were acquired using MRA technique without intravenous contrast. Angiographic images of the neck were acquired using MRA technique without and with intravenous contrast. Carotid stenosis measurements (when applicable) are obtained utilizing NASCET criteria, using the distal internal carotid diameter as the denominator. CONTRAST:  9.59mL GADAVIST GADOBUTROL 1 MMOL/ML IV SOLN COMPARISON:  No pertinent prior exam. FINDINGS: MR HEAD FINDINGS Brain: There is hyperintensity diffusion-weighted imaging the paramedian superior left frontal lobe. No acute or chronic hemorrhage. There is multifocal hyperintense T2-weighted signal within the white matter. Generalized volume loss  without a clear lobar predilection. The midline structures are normal. Vascular: Major flow voids are preserved. Skull and upper cervical spine: Normal  calvarium and skull base. Visualized upper cervical spine and soft tissues are normal. Sinuses/Orbits:No paranasal sinus fluid levels or advanced mucosal thickening. No mastoid or middle ear effusion. Normal orbits. MRA HEAD FINDINGS POSTERIOR CIRCULATION: --Vertebral arteries: Normal --Inferior cerebellar arteries: Normal. --Basilar artery: Normal. --Superior cerebellar arteries: Normal. --Posterior cerebral arteries: Normal. ANTERIOR CIRCULATION: --Intracranial internal carotid arteries: Normal. --Anterior cerebral arteries (ACA): Normal. --Middle cerebral arteries (MCA): Normal. ANATOMIC VARIANTS: None MRA NECK FINDINGS Aortic arch: Standard branching pattern Right carotid system: Normal Left carotid system: Normal Vertebral arteries: Normal left dominant system. Other: None. IMPRESSION: 1. Small focus of acute ischemia within the paramedian superior left frontal lobe. No hemorrhage or mass effect. 2. Normal MRA of the head and neck. 3. 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 Electronically Signed   By: Deatra Robinson M.D.   On: 02/05/2021 20:58   MR ANGIO NECK W WO CONTRAST  Result Date: 02/05/2021 CLINICAL DATA:  Neuro deficit, acute, stroke suspected EXAM: MRI HEAD WITHOUT CONTRAST MRA HEAD WITHOUT CONTRAST MRA OF THE NECK WITHOUT AND WITH CONTRAST TECHNIQUE: Multiplanar, multi-echo pulse sequences of the brain and surrounding structures were acquired without intravenous contrast. Angiographic images of the Circle of Willis were acquired using MRA technique without intravenous contrast. Angiographic images of the neck were acquired using MRA technique without and with intravenous contrast. Carotid stenosis measurements (when applicable) are obtained utilizing NASCET criteria, using the distal internal carotid diameter as the denominator. CONTRAST:  9.50mL GADAVIST GADOBUTROL 1 MMOL/ML IV SOLN COMPARISON:  No pertinent prior exam. FINDINGS: MR HEAD FINDINGS Brain: There is hyperintensity  diffusion-weighted imaging the paramedian superior left frontal lobe. No acute or chronic hemorrhage. There is multifocal hyperintense T2-weighted signal within the white matter. Generalized volume loss without a clear lobar predilection. The midline structures are normal. Vascular: Major flow voids are preserved. Skull and upper cervical spine: Normal calvarium and skull base. Visualized upper cervical spine and soft tissues are normal. Sinuses/Orbits:No paranasal sinus fluid levels or advanced mucosal thickening. No mastoid or middle ear effusion. Normal orbits. MRA HEAD FINDINGS POSTERIOR CIRCULATION: --Vertebral arteries: Normal --Inferior cerebellar arteries: Normal. --Basilar artery: Normal. --Superior cerebellar arteries: Normal. --Posterior cerebral arteries: Normal. ANTERIOR CIRCULATION: --Intracranial internal carotid arteries: Normal. --Anterior cerebral arteries (ACA): Normal. --Middle cerebral arteries (MCA): Normal. ANATOMIC VARIANTS: None MRA NECK FINDINGS Aortic arch: Standard branching pattern Right carotid system: Normal Left carotid system: Normal Vertebral arteries: Normal left dominant system. Other: None. IMPRESSION: 1. Small focus of acute ischemia within the paramedian superior left frontal lobe. No hemorrhage or mass effect. 2. Normal MRA of the head and neck. 3. 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 Electronically Signed   By: Deatra Robinson M.D.   On: 02/05/2021 20:58   MR BRAIN WO CONTRAST  Result Date: 02/05/2021 CLINICAL DATA:  Neuro deficit, acute, stroke suspected EXAM: MRI HEAD WITHOUT CONTRAST MRA HEAD WITHOUT CONTRAST MRA OF THE NECK WITHOUT AND WITH CONTRAST TECHNIQUE: Multiplanar, multi-echo pulse sequences of the brain and surrounding structures were acquired without intravenous contrast. Angiographic images of the Circle of Willis were acquired using MRA technique without intravenous contrast. Angiographic images of the neck were acquired using MRA technique  without and with intravenous contrast. Carotid stenosis measurements (when applicable) are obtained utilizing NASCET criteria, using the distal internal carotid diameter as the denominator. CONTRAST:  9.67mL GADAVIST GADOBUTROL 1 MMOL/ML IV  SOLN COMPARISON:  No pertinent prior exam. FINDINGS: MR HEAD FINDINGS Brain: There is hyperintensity diffusion-weighted imaging the paramedian superior left frontal lobe. No acute or chronic hemorrhage. There is multifocal hyperintense T2-weighted signal within the white matter. Generalized volume loss without a clear lobar predilection. The midline structures are normal. Vascular: Major flow voids are preserved. Skull and upper cervical spine: Normal calvarium and skull base. Visualized upper cervical spine and soft tissues are normal. Sinuses/Orbits:No paranasal sinus fluid levels or advanced mucosal thickening. No mastoid or middle ear effusion. Normal orbits. MRA HEAD FINDINGS POSTERIOR CIRCULATION: --Vertebral arteries: Normal --Inferior cerebellar arteries: Normal. --Basilar artery: Normal. --Superior cerebellar arteries: Normal. --Posterior cerebral arteries: Normal. ANTERIOR CIRCULATION: --Intracranial internal carotid arteries: Normal. --Anterior cerebral arteries (ACA): Normal. --Middle cerebral arteries (MCA): Normal. ANATOMIC VARIANTS: None MRA NECK FINDINGS Aortic arch: Standard branching pattern Right carotid system: Normal Left carotid system: Normal Vertebral arteries: Normal left dominant system. Other: None. IMPRESSION: 1. Small focus of acute ischemia within the paramedian superior left frontal lobe. No hemorrhage or mass effect. 2. Normal MRA of the head and neck. 3. 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 Electronically Signed   By: Deatra Robinson M.D.   On: 02/05/2021 20:58   ECHOCARDIOGRAM COMPLETE  Result Date: 02/06/2021    ECHOCARDIOGRAM REPORT   Patient Name:   Thomas Roberts Date of Exam: 02/06/2021 Medical Rec #:  [id number]     Height:        73.0 in Accession #:    [id number]    Weight:       206.3 lb Date of Birth:  March 21, 1946     BSA:          2.180 m Patient Age:    75 years      BP:           142/95 mmHg Patient Gender: M             HR:           62 bpm. Exam Location:  Inpatient Procedure: 2D Echo Indications:    stroke  History:        Patient has no prior history of Echocardiogram examinations.  Sonographer:    Delcie Roch Referring Phys: [id number] COLLEEN M STACK IMPRESSIONS  1. Left ventricular ejection fraction, by estimation, is 55%. The left ventricle has normal function. The left ventricle has no regional wall motion abnormalities. Left ventricular diastolic parameters are consistent with Grade I diastolic dysfunction (impaired relaxation).  2. Right ventricular systolic function is normal. The right ventricular size is normal. There is normal pulmonary artery systolic pressure. The estimated right ventricular systolic pressure is 29.6 mmHg.  3. The mitral valve is normal in structure. Trivial mitral valve regurgitation. No evidence of mitral stenosis.  4. The aortic valve is tricuspid. Aortic valve regurgitation is trivial. Mild aortic valve sclerosis is present, with no evidence of aortic valve stenosis.  5. Aortic dilatation noted. There is mild dilatation of the ascending aorta, measuring 41 mm.  6. The inferior vena cava is normal in size with greater than 50% respiratory variability, suggesting right atrial pressure of 3 mmHg. FINDINGS  Left Ventricle: Left ventricular ejection fraction, by estimation, is 55%. The left ventricle has normal function. The left ventricle has no regional wall motion abnormalities. The left ventricular internal cavity size was normal in size. There is no left ventricular hypertrophy. Left ventricular diastolic parameters are consistent with Grade I diastolic dysfunction (impaired relaxation). Right Ventricle: The right ventricular  size is normal. No increase in right ventricular wall thickness. Right  ventricular systolic function is normal. There is normal pulmonary artery systolic pressure. The tricuspid regurgitant velocity is 2.58 m/s, and  with an assumed right atrial pressure of 3 mmHg, the estimated right ventricular systolic pressure is 29.6 mmHg. Left Atrium: Left atrial size was normal in size. Right Atrium: Right atrial size was normal in size. Pericardium: There is no evidence of pericardial effusion. Mitral Valve: The mitral valve is normal in structure. Trivial mitral valve regurgitation. No evidence of mitral valve stenosis. Tricuspid Valve: The tricuspid valve is normal in structure. Tricuspid valve regurgitation is trivial. Aortic Valve: The aortic valve is tricuspid. Aortic valve regurgitation is trivial. Aortic regurgitation PHT measures 629 msec. Mild aortic valve sclerosis is present, with no evidence of aortic valve stenosis. Pulmonic Valve: The pulmonic valve was normal in structure. Pulmonic valve regurgitation is not visualized. Aorta: The aortic root is normal in size and structure and aortic dilatation noted. There is mild dilatation of the ascending aorta, measuring 41 mm. Venous: The inferior vena cava is normal in size with greater than 50% respiratory variability, suggesting right atrial pressure of 3 mmHg. IAS/Shunts: No atrial level shunt detected by color flow Doppler.  LEFT VENTRICLE PLAX 2D LVIDd:         5.20 cm  Diastology LVIDs:         3.40 cm  LV e' medial:    8.27 cm/s LV PW:         0.90 cm  LV E/e' medial:  6.3 LV IVS:        1.00 cm  LV e' lateral:   11.30 cm/s LVOT diam:     2.10 cm  LV E/e' lateral: 4.6 LV SV:         85 LV SV Index:   39 LVOT Area:     3.46 cm  RIGHT VENTRICLE             IVC RV S prime:     14.50 cm/s  IVC diam: 1.20 cm TAPSE (M-mode): 2.6 cm LEFT ATRIUM             Index       RIGHT ATRIUM           Index LA diam:        4.30 cm 1.97 cm/m  RA Area:     12.90 cm LA Vol (A2C):   63.0 ml 28.89 ml/m RA Volume:   28.80 ml  13.21 ml/m LA Vol (A4C):    60.1 ml 27.56 ml/m LA Biplane Vol: 64.5 ml 29.58 ml/m  AORTIC VALVE LVOT Vmax:   113.00 cm/s LVOT Vmean:  71.800 cm/s LVOT VTI:    0.244 m AI PHT:      629 msec  AORTA Ao Root diam: 3.30 cm Ao Asc diam:  4.10 cm MITRAL VALVE               TRICUSPID VALVE MV Area (PHT): 2.29 cm    TR Peak grad:   26.6 mmHg MV Decel Time: 331 msec    TR Vmax:        258.00 cm/s MV E velocity: 51.90 cm/s MV A velocity: 68.10 cm/s  SHUNTS MV E/A ratio:  0.76        Systemic VTI:  0.24 m  Systemic Diam: 2.10 cm Marca Ancona MD Electronically signed by Marca Ancona MD Signature Date/Time: 02/06/2021/3:49:23 PM    Final    CT HEAD CODE STROKE WO CONTRAST  Result Date: 02/05/2021 CLINICAL DATA:  Code stroke.  Neuro deficit, acute stroke suspected. EXAM: CT HEAD WITHOUT CONTRAST TECHNIQUE: Contiguous axial images were obtained from the base of the skull through the vertex without intravenous contrast. COMPARISON:  None. FINDINGS: Brain: Hypodensity within the paramidline left cerebellar hemisphere, compatible with the age indeterminate lacunar infarct. Vascular: No hyperdense vessel identified. Calcific atherosclerosis. Skull: No acute fracture. Sinuses/Orbits: Clear sinuses. Other: No mastoid effusions. ASPECTS [facility] Stroke Program Early CT Score) Total score (0-10 with 10 being normal): 10. IMPRESSION: 1. 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 An MRI could better evaluate if clinically indicated. 2. No evidence of acute large vascular territory infarct or acute hemorrhage. ASPECTS is 10. Code stroke imaging results were communicated on 02/05/2021 at 5:12 pm to provider Dr. Selina Cooley via secure text paging. Electronically Signed   By: Feliberto Harts MD   On: 02/05/2021 17:13     Subjective: No concerns. Strength is normal. No deficits noted.  Discharge Exam: Vitals:   02/06/21 1500 02/06/21 1600  BP: 126/82 134/87  Pulse: (!) 59 (!) 59  Resp: 13 13  Temp:     SpO2: 98% 97%   Vitals:   02/06/21 1300 02/06/21 1315 02/06/21 1500 02/06/21 1600  BP: (!) 147/80  126/82 134/87  Pulse: 63 66 (!) 59 (!) 59  Resp: 13 14 13 13   Temp:      TempSrc:      SpO2: 98% 97% 98% 97%  Weight:      Height:        General exam: Appears calm and comfortable Respiratory system: Clear to auscultation. Respiratory effort normal. Cardiovascular system: S1 & S2 heard, RRR. No murmurs, rubs, gallops or clicks. Gastrointestinal system: Abdomen is nondistended, soft and nontender. No organomegaly or masses felt. Normal bowel sounds heard. Central nervous system: Alert and oriented. No focal neurological deficits. Musculoskeletal: No edema. No calf tenderness Skin: No cyanosis. No rashes Psychiatry: Judgement and insight appear normal. Mood & affect appropriate.     The results of significant diagnostics from this hospitalization (including imaging, microbiology, ancillary and laboratory) are listed below for reference.     Microbiology: Recent Results (from the past 240 hour(s))  Resp Panel by RT-PCR (Flu A&B, Covid) Nasopharyngeal Swab     Status: None   Collection Time: 02/05/21  5:27 PM   Specimen: Nasopharyngeal Swab; Nasopharyngeal(NP) swabs in vial transport medium  Result Value Ref Range Status   SARS Coronavirus 2 by RT PCR NEGATIVE NEGATIVE Final    Comment: (NOTE) SARS-CoV-2 target nucleic acids are NOT DETECTED.  The SARS-CoV-2 RNA is generally detectable in upper respiratory specimens during the acute phase of infection. The lowest concentration of SARS-CoV-2 viral copies this assay can detect is 138 copies/mL. A negative result does not preclude SARS-Cov-2 infection and should not be used as the sole basis for treatment or other patient management decisions. A negative result may occur with  improper specimen collection/handling, submission of specimen other than nasopharyngeal swab, presence of viral mutation(s) within the areas targeted by  this assay, and inadequate number of viral copies(<138 copies/mL). A negative result must be combined with clinical observations, patient history, and epidemiological information. The expected result is Negative.  Fact Sheet for Patients:  02/07/21  Fact Sheet for Healthcare Providers:  BloggerCourse.com  This test is no t yet approved or cleared by the [place] and  has been authorized for detection and/or diagnosis of SARS-CoV-2 by FDA under an Emergency Use Authorization (EUA). This EUA will remain  in effect (meaning this test can be used) for the duration of the COVID-19 declaration under Section 564(b)(1) of the Act, 21 U.S.C.section 360bbb-3(b)(1), unless the authorization is terminated  or revoked sooner.       Influenza A by PCR NEGATIVE NEGATIVE Final   Influenza B by PCR NEGATIVE NEGATIVE Final    Comment: (NOTE) The Xpert Xpress SARS-CoV-2/FLU/RSV plus assay is intended as an aid in the diagnosis of influenza from Nasopharyngeal swab specimens and should not be used as a sole basis for treatment. Nasal washings and aspirates are unacceptable for Xpert Xpress SARS-CoV-2/FLU/RSV testing.  Fact Sheet for Patients: BloggerCourse.com  Fact Sheet for Healthcare Providers: SeriousBroker.it  This test is not yet approved or cleared by the [place] FDA and has been authorized for detection and/or diagnosis of SARS-CoV-2 by FDA under an Emergency Use Authorization (EUA). This EUA will remain in effect (meaning this test can be used) for the duration of the COVID-19 declaration under Section 564(b)(1) of the Act, 21 U.S.C. section 360bbb-3(b)(1), unless the authorization is terminated or revoked.  Performed at [facility], [address] [place], [place] [address]      Labs: BNP (last 3 results) No results for input(s): BNP in the last  8760 hours. Basic Metabolic Panel: Recent Labs  Lab 02/05/21 1702 02/05/21 1709 02/06/21 0840  NA 142 140  --   K 4.5 5.9* 4.1  CL 108 109  --   CO2 26  --   --   GLUCOSE 95 93  --   BUN 27* 44*  --   CREATININE 1.35* 1.30*  --   CALCIUM 9.6  --   --    Liver Function Tests: Recent Labs  Lab 02/05/21 1702  AST 22  ALT 25  ALKPHOS 100  BILITOT 0.6  PROT 6.6  ALBUMIN 4.1   No results for input(s): LIPASE, AMYLASE in the last 168 hours. No results for input(s): AMMONIA in the last 168 hours. CBC: Recent Labs  Lab 02/05/21 1702 02/05/21 1709  WBC 6.2  --   NEUTROABS 3.3  --   HGB 16.7 16.7  HCT 50.5 49.0  MCV 90.0  --   PLT 213  --    Cardiac Enzymes: No results for input(s): CKTOTAL, CKMB, CKMBINDEX, TROPONINI in the last 168 hours. BNP: Invalid input(s): POCBNP CBG: Recent Labs  Lab 02/05/21 1700  GLUCAP 87   D-Dimer No results for input(s): DDIMER in the last 72 hours. Hgb A1c Recent Labs    02/06/21 0339  HGBA1C 5.3   Lipid Profile Recent Labs    02/05/21 1809 02/06/21 0339  CHOL  --  147  HDL  --  56  LDLCALC  --  67  TRIG  --  120  CHOLHDL  --  2.6  LDLDIRECT 86.5  --    Thyroid function studies No results for input(s): TSH, T4TOTAL, T3FREE, THYROIDAB in the last 72 hours.  Invalid input(s): FREET3 Anemia work up No results for input(s): VITAMINB12, FOLATE, FERRITIN, TIBC, IRON, RETICCTPCT in the last 72 hours. Urinalysis    Component Value Date/Time   COLORURINE YELLOW 02/05/2021 1809   APPEARANCEUR CLEAR 02/05/2021 1809   LABSPEC 1.026 02/05/2021 1809   PHURINE 5.0 02/05/2021 1809   GLUCOSEU NEGATIVE  02/05/2021 1809   HGBUR NEGATIVE 02/05/2021 1809   BILIRUBINUR NEGATIVE 02/05/2021 1809   KETONESUR NEGATIVE 02/05/2021 1809   PROTEINUR NEGATIVE 02/05/2021 1809   NITRITE NEGATIVE 02/05/2021 1809   LEUKOCYTESUR NEGATIVE 02/05/2021 1809   Sepsis Labs Invalid input(s): PROCALCITONIN,  WBC,  LACTICIDVEN Microbiology Recent  Results (from the past 240 hour(s))  Resp Panel by RT-PCR (Flu A&B, Covid) Nasopharyngeal Swab     Status: None   Collection Time: 02/05/21  5:27 PM   Specimen: Nasopharyngeal Swab; Nasopharyngeal(NP) swabs in vial transport medium  Result Value Ref Range Status   SARS Coronavirus 2 by RT PCR NEGATIVE NEGATIVE Final    Comment: (NOTE) SARS-CoV-2 target nucleic acids are NOT DETECTED.  The SARS-CoV-2 RNA is generally detectable in upper respiratory specimens during the acute phase of infection. The lowest concentration of SARS-CoV-2 viral copies this assay can detect is 138 copies/mL. A negative result does not preclude SARS-Cov-2 infection and should not be used as the sole basis for treatment or other patient management decisions. A negative result may occur with  improper specimen collection/handling, submission of specimen other than nasopharyngeal swab, presence of viral mutation(s) within the areas targeted by this assay, and inadequate number of viral copies(<138 copies/mL). A negative result must be combined with clinical observations, patient history, and epidemiological information. The expected result is Negative.  Fact Sheet for Patients:  BloggerCourse.com  Fact Sheet for Healthcare Providers:  SeriousBroker.it  This test is no t yet approved or cleared by the [place] FDA and  has been authorized for detection and/or diagnosis of SARS-CoV-2 by FDA under an Emergency Use Authorization (EUA). This EUA will remain  in effect (meaning this test can be used) for the duration of the COVID-19 declaration under Section 564(b)(1) of the Act, 21 U.S.C.section 360bbb-3(b)(1), unless the authorization is terminated  or revoked sooner.       Influenza A by PCR NEGATIVE NEGATIVE Final   Influenza B by PCR NEGATIVE NEGATIVE Final    Comment: (NOTE) The Xpert Xpress SARS-CoV-2/FLU/RSV plus assay is intended as an aid in the  diagnosis of influenza from Nasopharyngeal swab specimens and should not be used as a sole basis for treatment. Nasal washings and aspirates are unacceptable for Xpert Xpress SARS-CoV-2/FLU/RSV testing.  Fact Sheet for Patients: BloggerCourse.com  Fact Sheet for Healthcare Providers: SeriousBroker.it  This test is not yet approved or cleared by the [place] FDA and has been authorized for detection and/or diagnosis of SARS-CoV-2 by FDA under an Emergency Use Authorization (EUA). This EUA will remain in effect (meaning this test can be used) for the duration of the COVID-19 declaration under Section 564(b)(1) of the Act, 21 U.S.C. section 360bbb-3(b)(1), unless the authorization is terminated or revoked.  Performed at [facility], [address] [place], [place] [address]      SIGNED:   Jacquelin Hawking, MD [facility] 02/06/2021, 4:22 PM

## 2021-02-06 NOTE — Evaluation (Signed)
Physical Therapy Evaluation Patient Details Name: Thomas Roberts MRN: [id number] DOB: 1946-02-04 Today's Date: 02/06/2021   History of Present Illness  Ricci Montone is a 75 y.o. male presented with sudden onset of right sided weakness and feeling dizzy. 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 no significant PMH other than OA and cervical radiculopathy  Clinical Impression   Patient evaluated by Physical Therapy with no further acute PT needs identified. Independent with mobility and amb; All education has been completed and the patient has no further questions.  See below for any follow-up Physical Therapy or equipment needs. PT is signing off. Thank you for this referral.     Follow Up Recommendations No PT follow up    Equipment Recommendations  None recommended by PT    Recommendations for Other Services       Precautions / Restrictions Precautions Precautions: None      Mobility  Bed Mobility Overal bed mobility: Independent                  Transfers Overall transfer level: Independent                  Ambulation/Gait Ambulation/Gait assistance: Independent Gait Distance (Feet): 200 Feet Assistive device: None Gait Pattern/deviations: WFL(Within Functional Limits) Gait velocity: WNL   General Gait Details: R "foot slap" noticeable to pt, but did not interfere with gait safety  Stairs            Wheelchair Mobility    Modified Rankin (Stroke Patients Only)       Balance Overall balance assessment: Independent (including walking backwards and braiding)                                           Pertinent Vitals/Pain Pain Assessment: No/denies pain    Home Living Family/patient expects to be discharged to:: Private residence Living Arrangements: Spouse/significant other Available Help at Discharge: Family Type of Home: House Home Access: Stairs to enter   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: 1 Home Layout: Two level;Able to live on main level with bedroom/bathroom Home Equipment: None      Prior Function Level of Independence: Independent         Comments: Still working; plays soccer, is a Engineer, civil (consulting)        Extremity/Trunk Assessment   Upper Extremity Assessment Upper Extremity Assessment: Overall WFL for tasks assessed    Lower Extremity Assessment Lower Extremity Assessment: Overall WFL for tasks assessed (slight decr strength R hamstrings and ankle dorsiflexion)    Cervical / Trunk Assessment Cervical / Trunk Assessment: Normal  Communication   Communication: No difficulties  Cognition Arousal/Alertness: Awake/alert Behavior During Therapy: WFL for tasks assessed/performed                                          General Comments General comments (skin integrity, edema, etc.): Discussed signs and symptoms of stroke and BE FAST; Pt verbalized understanding    Exercises     Assessment/Plan    PT Assessment Patent does not need any further PT services  PT Problem List         PT Treatment Interventions      PT Goals (Current goals can be found  in the Care Plan section)  Acute Rehab PT Goals Patient Stated Goal: "Home soon" PT Goal Formulation: All assessment and education complete, DC therapy    Frequency     Barriers to discharge        Co-evaluation               AM-PAC PT "6 Clicks" Mobility  Outcome Measure Help needed turning from your back to your side while in a flat bed without using bedrails?: None Help needed moving from lying on your back to sitting on the side of a flat bed without using bedrails?: None Help needed moving to and from a bed to a chair (including a wheelchair)?: None Help needed standing up from a chair using your arms (e.g., wheelchair or bedside chair)?: None Help needed to walk in hospital room?: None Help needed climbing 3-5 steps with a railing? : None 6  Click Score: 24    End of Session   Activity Tolerance: Patient tolerated treatment well Patient left: Other (comment) (managing independently in room) Nurse Communication: Mobility status PT Visit Diagnosis: Other abnormalities of gait and mobility (R26.89)    Time: 1219-7588 PT Time Calculation (min) (ACUTE ONLY): 24 min   Charges:   PT Evaluation $PT Eval Low Complexity: 1 Low PT Treatments $Gait Training: 8-22 mins        Van Clines, PT  Acute Rehabilitation Services Pager [phone] Office [phone]   Levi Aland 02/06/2021, 12:00 PM

## 2021-02-06 NOTE — Discharge Instructions (Signed)
Thomas Roberts,  You are in the hospital and found to have a stroke.  The source of the stroke was not identified and it is recommended that you receive a device called a loop recorder which will look for irregular heart rhythms.  This will be done as an outpatient.  With regard to medications, you have been started on aspirin 81 mg daily and Plavix 75 mg daily.  He will continue Plavix only for 21 days.  Please follow-up with your primary care physician in addition to the neurologist and the cardiologist.  Incidentally, your heart ultrasound showed a slight widening of your aorta; I recommend that you follow-up with your primary care physician to have this monitored as an outpatient however there is no urgent need for management.

## 2021-02-06 NOTE — Progress Notes (Addendum)
   Asked to see patient for loop recorder. Unfortunately not able to place in ED and work up is on-going with potential to discharge today from ED.   If he is admitted to floor and stays could plan for later date as long as Echo WNL.   In the event leaves from ED, have schedule outpatient visit with EP APP to discuss indications for loop.   Casimiro Needle [address] [place], PA-C  02/06/2021 2:29 PM

## 2021-02-06 NOTE — Progress Notes (Signed)
OT Cancellation Note  Patient Details Name: Thomas Roberts MRN: [id number] DOB: 01/12/1946   Cancelled Treatment:    Reason Eval/Treat Not Completed: OT screened, no needs identified, will sign off (Discussed with PT)  Theola Cuellar,HILLARY 02/06/2021, 12:17 PM Luisa Dago, OT/L   Acute OT Clinical Specialist Acute Rehabilitation Services Pager [phone] Office [phone]

## 2021-02-22 NOTE — Progress Notes (Signed)
ELECTROPHYSIOLOGY CONSULT NOTE  Patient ID: Thomas Roberts Eakins MRN: [id number], DOB/AGE: May 19, 1946   Admit date: (Not on file) Date of Consult: 02/25/2021  Primary Physician: Pcp, No Primary Cardiologist: None  Primary Electrophysiologist: New to Dr. Elberta Fortisamnitz Reason for Consultation: Cryptogenic stroke; recommendations regarding Implantable Loop Recorder [facility]  History of Present Illness EP has been asked to evaluate Thomas Roberts [place] for placement of an implantable loop recorder to monitor for atrial fibrillation by Dr Roda ShuttersXu.  The patient was admitted on 02/05/21 with acute onset right upper and lower extremity weakness.    He has undergone workup for stroke including:  Imaging showed 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  Imaging findings concerning for embolic source. No known history of atrial fibrillation. No afib on telemetry since admission. CT head w/o: no acute findings. ASPECTS 10 MRI brain: small focus of acute ischemia within the left frontal lobe. MRA head/neck: no LVO, mild chronic small vessel disease 2D Echo ejection fraction 60 to 55%.  No wall motion abnormalities.  LDL 67 HgbA1c 5.3 Recommend loop recorder placement prior to discharge.  If not possible today it can be done as an outpatient as per cardiology discretion. DAPT with plavix and aspirin for 21 days followed by aspirin alone     The patient has been monitored on telemetry which has demonstrated sinus rhythm with no arrhythmias.  Inpatient stroke work-up did not require a TEE per Neurology given age.  Echocardiogram as above. Lab work is reviewed.  Prior to admission, the patient denies chest pain, shortness of breath, dizziness, or syncope. No clear bothersome  palpitations. Overall very active. He is currently without deficit and doing very well overall.   Past medical history OA CVA  Cervical radiculopathy DDD  Surgical History:  Close reduction nasal bone  fracture Knee surgery right Lipoma resections  Inpatient Medications:   Allergies: No Known Allergies  Social History   Socioeconomic History   Marital status: Married    Spouse name: Not on file   Number of children: Not on file   Years of education: Not on file   Highest education level: Not on file  Occupational History   Not on file  Tobacco Use   Smoking status: Never   Smokeless tobacco: Never  Substance and Sexual Activity   Alcohol use: Not Currently   Drug use: Not Currently   Sexual activity: Not on file  Other Topics Concern   Not on file  Social History Narrative   Not on file   Social Determinants of Health   Financial Resource Strain: Not on file  Food Insecurity: Not on file  Transportation Needs: Not on file  Physical Activity: Not on file  Stress: Not on file  Social Connections: Not on file  Intimate Partner Violence: Not on file     Family history Pacemaker, father Heart disease Paternal grandmother and grandfather   Review of Systems: All other systems reviewed and are otherwise negative except as noted above.  Physical Exam: Vitals:   02/25/21 0947  BP: 128/68  Pulse: 60  SpO2: 98%  Weight: 205 lb (93 kg)  Height: 6\' 1"  (1.854 m)    GEN- The patient is well appearing, alert and oriented x 3 today.   Head- normocephalic, atraumatic Eyes-  Sclera clear, conjunctiva pink Ears- hearing intact Oropharynx- clear Neck- supple Lungs- Clear to ausculation bilaterally, normal work of breathing Heart- Regular rate and rhythm, no murmurs, rubs or gallops  GI-  soft, NT, ND, + BS Extremities- no clubbing, cyanosis, or edema MS- no significant deformity or atrophy Skin- no rash or lesion Psych- euthymic mood, full affect   Labs:   Lab Results  Component Value Date   WBC 6.2 02/05/2021   HGB 16.7 02/05/2021   HCT 49.0 02/05/2021   MCV 90.0 02/05/2021   PLT 213 02/05/2021   No results for input(s): NA, K, CL, CO2, BUN, CREATININE,  CALCIUM, PROT, BILITOT, ALKPHOS, ALT, AST, GLUCOSE in the last 168 hours.  Invalid input(s): LABALBU   Radiology/Studies: MR ANGIO HEAD WO CONTRAST  Result Date: 02/05/2021 CLINICAL DATA:  Neuro deficit, acute, stroke suspected EXAM: MRI HEAD WITHOUT CONTRAST MRA HEAD WITHOUT CONTRAST MRA OF THE NECK WITHOUT AND WITH CONTRAST TECHNIQUE: Multiplanar, multi-echo pulse sequences of the brain and surrounding structures were acquired without intravenous contrast. Angiographic images of the Circle of Willis were acquired using MRA technique without intravenous contrast. Angiographic images of the neck were acquired using MRA technique without and with intravenous contrast. Carotid stenosis measurements (when applicable) are obtained utilizing NASCET criteria, using the distal internal carotid diameter as the denominator. CONTRAST:  9.21mL GADAVIST GADOBUTROL 1 MMOL/ML IV SOLN COMPARISON:  No pertinent prior exam. FINDINGS: MR HEAD FINDINGS Brain: There is hyperintensity diffusion-weighted imaging the paramedian superior left frontal lobe. No acute or chronic hemorrhage. There is multifocal hyperintense T2-weighted signal within the white matter. Generalized volume loss without a clear lobar predilection. The midline structures are normal. Vascular: Major flow voids are preserved. Skull and upper cervical spine: Normal calvarium and skull base. Visualized upper cervical spine and soft tissues are normal. Sinuses/Orbits:No paranasal sinus fluid levels or advanced mucosal thickening. No mastoid or middle ear effusion. Normal orbits. MRA HEAD FINDINGS POSTERIOR CIRCULATION: --Vertebral arteries: Normal --Inferior cerebellar arteries: Normal. --Basilar artery: Normal. --Superior cerebellar arteries: Normal. --Posterior cerebral arteries: Normal. ANTERIOR CIRCULATION: --Intracranial internal carotid arteries: Normal. --Anterior cerebral arteries (ACA): Normal. --Middle cerebral arteries (MCA): Normal. ANATOMIC VARIANTS:  None MRA NECK FINDINGS Aortic arch: Standard branching pattern Right carotid system: Normal Left carotid system: Normal Vertebral arteries: Normal left dominant system. Other: None. IMPRESSION: 1. Small focus of acute ischemia within the paramedian superior left frontal lobe. No hemorrhage or mass effect. 2. Normal MRA of the head and neck. 3. 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 Electronically Signed   By: Deatra Robinson M.D.   On: 02/05/2021 20:58   MR ANGIO NECK W WO CONTRAST  Result Date: 02/05/2021 CLINICAL DATA:  Neuro deficit, acute, stroke suspected EXAM: MRI HEAD WITHOUT CONTRAST MRA HEAD WITHOUT CONTRAST MRA OF THE NECK WITHOUT AND WITH CONTRAST TECHNIQUE: Multiplanar, multi-echo pulse sequences of the brain and surrounding structures were acquired without intravenous contrast. Angiographic images of the Circle of Willis were acquired using MRA technique without intravenous contrast. Angiographic images of the neck were acquired using MRA technique without and with intravenous contrast. Carotid stenosis measurements (when applicable) are obtained utilizing NASCET criteria, using the distal internal carotid diameter as the denominator. CONTRAST:  9.74mL GADAVIST GADOBUTROL 1 MMOL/ML IV SOLN COMPARISON:  No pertinent prior exam. FINDINGS: MR HEAD FINDINGS Brain: There is hyperintensity diffusion-weighted imaging the paramedian superior left frontal lobe. No acute or chronic hemorrhage. There is multifocal hyperintense T2-weighted signal within the white matter. Generalized volume loss without a clear lobar predilection. The midline structures are normal. Vascular: Major flow voids are preserved. Skull and upper cervical spine: Normal calvarium and skull base. Visualized upper cervical spine and soft tissues are normal. Sinuses/Orbits:No  paranasal sinus fluid levels or advanced mucosal thickening. No mastoid or middle ear effusion. Normal orbits. MRA HEAD FINDINGS POSTERIOR CIRCULATION:  --Vertebral arteries: Normal --Inferior cerebellar arteries: Normal. --Basilar artery: Normal. --Superior cerebellar arteries: Normal. --Posterior cerebral arteries: Normal. ANTERIOR CIRCULATION: --Intracranial internal carotid arteries: Normal. --Anterior cerebral arteries (ACA): Normal. --Middle cerebral arteries (MCA): Normal. ANATOMIC VARIANTS: None MRA NECK FINDINGS Aortic arch: Standard branching pattern Right carotid system: Normal Left carotid system: Normal Vertebral arteries: Normal left dominant system. Other: None. IMPRESSION: 1. Small focus of acute ischemia within the paramedian superior left frontal lobe. No hemorrhage or mass effect. 2. Normal MRA of the head and neck. 3. 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 Electronically Signed   By: Deatra Robinson M.D.   On: 02/05/2021 20:58   MR BRAIN WO CONTRAST  Result Date: 02/05/2021 CLINICAL DATA:  Neuro deficit, acute, stroke suspected EXAM: MRI HEAD WITHOUT CONTRAST MRA HEAD WITHOUT CONTRAST MRA OF THE NECK WITHOUT AND WITH CONTRAST TECHNIQUE: Multiplanar, multi-echo pulse sequences of the brain and surrounding structures were acquired without intravenous contrast. Angiographic images of the Circle of Willis were acquired using MRA technique without intravenous contrast. Angiographic images of the neck were acquired using MRA technique without and with intravenous contrast. Carotid stenosis measurements (when applicable) are obtained utilizing NASCET criteria, using the distal internal carotid diameter as the denominator. CONTRAST:  9.43mL GADAVIST GADOBUTROL 1 MMOL/ML IV SOLN COMPARISON:  No pertinent prior exam. FINDINGS: MR HEAD FINDINGS Brain: There is hyperintensity diffusion-weighted imaging the paramedian superior left frontal lobe. No acute or chronic hemorrhage. There is multifocal hyperintense T2-weighted signal within the white matter. Generalized volume loss without a clear lobar predilection. The midline structures are normal.  Vascular: Major flow voids are preserved. Skull and upper cervical spine: Normal calvarium and skull base. Visualized upper cervical spine and soft tissues are normal. Sinuses/Orbits:No paranasal sinus fluid levels or advanced mucosal thickening. No mastoid or middle ear effusion. Normal orbits. MRA HEAD FINDINGS POSTERIOR CIRCULATION: --Vertebral arteries: Normal --Inferior cerebellar arteries: Normal. --Basilar artery: Normal. --Superior cerebellar arteries: Normal. --Posterior cerebral arteries: Normal. ANTERIOR CIRCULATION: --Intracranial internal carotid arteries: Normal. --Anterior cerebral arteries (ACA): Normal. --Middle cerebral arteries (MCA): Normal. ANATOMIC VARIANTS: None MRA NECK FINDINGS Aortic arch: Standard branching pattern Right carotid system: Normal Left carotid system: Normal Vertebral arteries: Normal left dominant system. Other: None. IMPRESSION: 1. Small focus of acute ischemia within the paramedian superior left frontal lobe. No hemorrhage or mass effect. 2. Normal MRA of the head and neck. 3. 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 Electronically Signed   By: Deatra Robinson M.D.   On: 02/05/2021 20:58   ECHOCARDIOGRAM COMPLETE  Result Date: 02/06/2021    ECHOCARDIOGRAM REPORT   Patient Name:   BOEN STERBENZ Date of Exam: 02/06/2021 Medical Rec #:  [id number]     Height:       73.0 in Accession #:    [id number]    Weight:       206.3 lb Date of Birth:  13-Nov-1945     BSA:          2.180 m Patient Age:    75 years      BP:           142/95 mmHg Patient Gender: M             HR:           62 bpm. Exam Location:  Inpatient Procedure: 2D Echo Indications:    stroke  History:        Patient has no prior history of Echocardiogram examinations.  Sonographer:    Delcie Roch Referring Phys: [id number] COLLEEN M STACK IMPRESSIONS  1. Left ventricular ejection fraction, by estimation, is 55%. The left ventricle has normal function. The left ventricle has no regional wall motion  abnormalities. Left ventricular diastolic parameters are consistent with Grade I diastolic dysfunction (impaired relaxation).  2. Right ventricular systolic function is normal. The right ventricular size is normal. There is normal pulmonary artery systolic pressure. The estimated right ventricular systolic pressure is 29.6 mmHg.  3. The mitral valve is normal in structure. Trivial mitral valve regurgitation. No evidence of mitral stenosis.  4. The aortic valve is tricuspid. Aortic valve regurgitation is trivial. Mild aortic valve sclerosis is present, with no evidence of aortic valve stenosis.  5. Aortic dilatation noted. There is mild dilatation of the ascending aorta, measuring 41 mm.  6. The inferior vena cava is normal in size with greater than 50% respiratory variability, suggesting right atrial pressure of 3 mmHg. FINDINGS  Left Ventricle: Left ventricular ejection fraction, by estimation, is 55%. The left ventricle has normal function. The left ventricle has no regional wall motion abnormalities. The left ventricular internal cavity size was normal in size. There is no left ventricular hypertrophy. Left ventricular diastolic parameters are consistent with Grade I diastolic dysfunction (impaired relaxation). Right Ventricle: The right ventricular size is normal. No increase in right ventricular wall thickness. Right ventricular systolic function is normal. There is normal pulmonary artery systolic pressure. The tricuspid regurgitant velocity is 2.58 m/s, and  with an assumed right atrial pressure of 3 mmHg, the estimated right ventricular systolic pressure is 29.6 mmHg. Left Atrium: Left atrial size was normal in size. Right Atrium: Right atrial size was normal in size. Pericardium: There is no evidence of pericardial effusion. Mitral Valve: The mitral valve is normal in structure. Trivial mitral valve regurgitation. No evidence of mitral valve stenosis. Tricuspid Valve: The tricuspid valve is normal in  structure. Tricuspid valve regurgitation is trivial. Aortic Valve: The aortic valve is tricuspid. Aortic valve regurgitation is trivial. Aortic regurgitation PHT measures 629 msec. Mild aortic valve sclerosis is present, with no evidence of aortic valve stenosis. Pulmonic Valve: The pulmonic valve was normal in structure. Pulmonic valve regurgitation is not visualized. Aorta: The aortic root is normal in size and structure and aortic dilatation noted. There is mild dilatation of the ascending aorta, measuring 41 mm. Venous: The inferior vena cava is normal in size with greater than 50% respiratory variability, suggesting right atrial pressure of 3 mmHg. IAS/Shunts: No atrial level shunt detected by color flow Doppler.  LEFT VENTRICLE PLAX 2D LVIDd:         5.20 cm  Diastology LVIDs:         3.40 cm  LV e' medial:    8.27 cm/s LV PW:         0.90 cm  LV E/e' medial:  6.3 LV IVS:        1.00 cm  LV e' lateral:   11.30 cm/s LVOT diam:     2.10 cm  LV E/e' lateral: 4.6 LV SV:         85 LV SV Index:   39 LVOT Area:     3.46 cm  RIGHT VENTRICLE             IVC RV S prime:     14.50 cm/s  IVC diam: 1.20 cm TAPSE (M-mode):  2.6 cm LEFT ATRIUM             Index       RIGHT ATRIUM           Index LA diam:        4.30 cm 1.97 cm/m  RA Area:     12.90 cm LA Vol (A2C):   63.0 ml 28.89 ml/m RA Volume:   28.80 ml  13.21 ml/m LA Vol (A4C):   60.1 ml 27.56 ml/m LA Biplane Vol: 64.5 ml 29.58 ml/m  AORTIC VALVE LVOT Vmax:   113.00 cm/s LVOT Vmean:  71.800 cm/s LVOT VTI:    0.244 m AI PHT:      629 msec  AORTA Ao Root diam: 3.30 cm Ao Asc diam:  4.10 cm MITRAL VALVE               TRICUSPID VALVE MV Area (PHT): 2.29 cm    TR Peak grad:   26.6 mmHg MV Decel Time: 331 msec    TR Vmax:        258.00 cm/s MV E velocity: 51.90 cm/s MV A velocity: 68.10 cm/s  SHUNTS MV E/A ratio:  0.76        Systemic VTI:  0.24 m                            Systemic Diam: 2.10 cm Marca Ancona MD Electronically signed by Marca Ancona MD Signature  Date/Time: 02/06/2021/3:49:23 PM    Final    CT HEAD CODE STROKE WO CONTRAST  Result Date: 02/05/2021 CLINICAL DATA:  Code stroke.  Neuro deficit, acute stroke suspected. EXAM: CT HEAD WITHOUT CONTRAST TECHNIQUE: Contiguous axial images were obtained from the base of the skull through the vertex without intravenous contrast. COMPARISON:  None. FINDINGS: Brain: Hypodensity within the paramidline left cerebellar hemisphere, compatible with the age indeterminate lacunar infarct. Vascular: No hyperdense vessel identified. Calcific atherosclerosis. Skull: No acute fracture. Sinuses/Orbits: Clear sinuses. Other: No mastoid effusions. ASPECTS [facility] Stroke Program Early CT Score) Total score (0-10 with 10 being normal): 10. IMPRESSION: 1. 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 An MRI could better evaluate if clinically indicated. 2. No evidence of acute large vascular territory infarct or acute hemorrhage. ASPECTS is 10. Code stroke imaging results were communicated on 02/05/2021 at 5:12 pm to provider Dr. Selina Cooley via secure text paging. Electronically Signed   By: Feliberto Harts MD   On: 02/05/2021 17:13    12-lead ECG on admission showed NSR at 70 bpm (personally reviewed) All prior EKG's in EPIC reviewed with no documented atrial fibrillation  No mention of atrial arrhythmias noted during admission re: Telemetry.  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  The patient did not require a TEE.  He was discharged from the emergency room which precluded loop placement.   Today, I have spoken at length with the patient about monitoring for afib with an implantable loop recorder. Risks, benefits, and alteratives to implantable loop recorder were discussed with the patient today.   At this time, the patient is very clear in their decision to proceed with implantable loop recorder. Give he has a managed care account, I have  confirmed with other caregivers in the office today that wearable monitoring will likely be required first.  Thus, we will proceed with 30 day event monitor once he gets back from his trip to [place],  with plans to see Dr. Elberta Fortis afterward for loop consideration.   Graciella Freer, PA-C 02/25/2021 10:17 AM

## 2021-02-25 ENCOUNTER — Encounter: Payer: Self-pay | Admitting: Student

## 2021-02-25 ENCOUNTER — Ambulatory Visit (INDEPENDENT_AMBULATORY_CARE_PROVIDER_SITE_OTHER): Payer: 59 | Admitting: Student

## 2021-02-25 ENCOUNTER — Encounter: Payer: Self-pay | Admitting: *Deleted

## 2021-02-25 ENCOUNTER — Other Ambulatory Visit: Payer: Self-pay

## 2021-02-25 VITALS — BP 128/68 | HR 60 | Ht 73.0 in | Wt 205.0 lb

## 2021-02-25 DIAGNOSIS — I639 Cerebral infarction, unspecified: Secondary | ICD-10-CM

## 2021-02-25 NOTE — Progress Notes (Signed)
Patient ID: Thomas Roberts, male   DOB: 11/06/1945, 75 y.o.   MRN: [id number] Patient enrolled for [employer] to ship a 30 day cardiac event monitor to his home for anticipated start date of 03/26/2021.

## 2021-02-25 NOTE — Patient Instructions (Addendum)
Medication Instructions:  Your physician recommends that you continue on your current medications as directed. Please refer to the Current Medication list given to you today.  *If you need a refill on your cardiac medications before your next appointment, please call your pharmacy*   Lab Work: none If you have labs (blood work) drawn today and your tests are completely normal, you will receive your results only by: MyChart Message (if you have MyChart) OR A paper copy in the mail If you have any lab test that is abnormal or we need to change your treatment, we will call you to review the results.   Follow-Up: At [facility], you and your health needs are our priority.  As part of our continuing mission to provide you with exceptional heart care, we have created designated Provider Care Teams.  These Care Teams include your primary Cardiologist (physician) and Advanced Practice Providers (APPs -  Physician Assistants and Nurse Practitioners) who all work together to provide you with the care you need, when you need it.   Your next appointment:   04/29/2021  The format for your next appointment:   In Person  Provider:   Loman Brooklyn, MD   Other Instructions *Call or send [place] a MyChart message to let us know they day you start wearing your monitor*   [employer] Cardiac Event Monitor Instructions Your physician has requested you wear your cardiac event monitor for __30___ days, (1-30). [employer] may call or text to confirm a shipping address. The monitor will be sent to a land address via UPS. [employer] will not ship a monitor to a PO BOX. It typically takes 3-5 days to receive your monitor after it has been enrolled. [employer] will assist with USPS tracking if your package is delayed. The telephone number for [employer] is [phone]. Once you have received your monitor, please review the enclosed instructions. Instruction tutorials can also be viewed under help and  settings on the enclosed cell phone. Your monitor has already been registered assigning a specific monitor serial # to you.  Applying the monitor Remove cell phone from case and turn it on. The cell phone works as IT consultant and needs to be within [employer] of you at all times. The cell phone will need to be charged on a daily basis. We recommend you plug the cell phone into the enclosed charger at your bedside table every night.  Monitor batteries: You will receive two monitor batteries labelled #1 and #2. These are your recorders. Plug battery #2 onto the second connection on the enclosed charger. Keep one battery on the charger at all times. This will keep the monitor battery deactivated. It will also keep it fully charged for when you need to switch your monitor batteries. A small light will be blinking on the battery emblem when it is charging. The light on the battery emblem will remain on when the battery is fully charged.  Open package of a Monitor strip. Insert battery #1 into black hood on strip and gently squeeze monitor battery onto connection as indicated in instruction booklet. Set aside while preparing skin.  Choose location for your strip, vertical or horizontal, as indicated in the instruction booklet. Shave to remove all hair from location. There cannot be any lotions, oils, powders, or colognes on skin where monitor is to be applied. Wipe skin clean with enclosed Saline wipe. Dry skin completely.  Peel paper labeled #1 off the back of the Monitor strip exposing the adhesive. Place the  monitor on the chest in the vertical or horizontal position shown in the instruction booklet. One arrow on the monitor strip must be pointing upward. Carefully remove paper labeled #2, attaching remainder of strip to your skin. Try not to create any folds or wrinkles in the strip as you apply it.  Firmly press and release the circle in the center of the monitor battery. You will hear a  small beep. This is turning the monitor battery on. The heart emblem on the monitor battery will light up every 5 seconds if the monitor battery in turned on and connected to the patient securely. Do not push and hold the circle down as this turns the monitor battery off. The cell phone will locate the monitor battery. A screen will appear on the cell phone checking the connection of your monitor strip. This may read poor connection initially but change to good connection within the next minute. Once your monitor accepts the connection you will hear a series of 3 beeps followed by a climbing crescendo of beeps. A screen will appear on the cell phone showing the two monitor strip placement options. Touch the picture that demonstrates where you applied the monitor strip.  Your monitor strip and battery are waterproof. You are able to shower, bathe, or swim with the monitor on. They just ask you do not submerge deeper than 3 feet underwater. We recommend removing the monitor if you are swimming in a lake, river, or ocean.  Your monitor battery will need to be switched to a fully charged monitor battery approximately once a week. The cell phone will alert you of an action which needs to be made.  On the cell phone, tap for details to reveal connection status, monitor battery status, and cell phone battery status. The green dots indicates your monitor is in good status. A red dot indicates there is something that needs your attention.  To record a symptom, click the circle on the monitor battery. In 30-60 seconds a list of symptoms will appear on the cell phone. Select your symptom and tap save. Your monitor will record a sustained or significant arrhythmia regardless of you clicking the button. Some patients do not feel the heart rhythm irregularities. [employer] will notify us of any serious or critical events.  Refer to instruction booklet for instructions on switching batteries, changing  strips, the Do not disturb or Pause features, or any additional questions.  Call [employer] at [phone], to confirm your monitor is transmitting and record your baseline. They will answer any questions you may have regarding the monitor instructions at that time.  Returning the monitor to [employer] Place all equipment back into blue box. Peel off strip of paper to expose adhesive and close box securely. There is a prepaid UPS shipping label on this box. Drop in a UPS drop box, or at a UPS facility like [employer]. You may also contact [employer] to arrange UPS to pick up monitor package at your home.

## 2021-03-19 ENCOUNTER — Inpatient Hospital Stay: Payer: Medicare Other | Admitting: Adult Health

## 2021-03-19 NOTE — Progress Notes (Signed)
[facility] [address] [place] [address] [phone]       HOSPITAL FOLLOW UP NOTE  [name] Date of Birth:  [birth year] Medical Record Number:  [id number]   Reason for Referral:  hospital stroke follow up    SUBJECTIVE:   CHIEF COMPLAINT:  Chief Complaint  Patient presents with   Follow-up    RM 3 with spouse linda Pt is well, is having some dizziness/ equilibrium complications  . All other stroke symptoms have resolved, per pt.     HPI:   [name] is a 75 y.o. male with no significant medical problems that presented to [facility] ED on 02/05/21 for acute onset right upper and lower extremity weakness and was found to have a small left front lobe infarct. NIHHS 0.  Personally reviewed hospitalization pertinent progress notes, lab work and imaging.  Evaluated by Dr. Pearlean Brownie for left frontal lobe infarct, embolic of cryptogenic etiology.  Recommended loop recorder to evaluate for possible A. Fib - referral to EP to discuss outpatient.  MRA head/neck unremarkable.  EF 60 to 65%.  LDL 67.  A1c 5.3.  Recommended DAPT for 3 weeks followed by aspirin alone.  Other stroke risk factors include advanced age.  PT/OT no therapy needs.    Reports compliance on aspirin and was started on atorvastatin 80 mg daily by PCP as LDL 93 (8/17).  Blood pressure today 121/81. Plans on starting cardiac monitor next week for 30 days.       PERTINENT IMAGING  MR BRAIN 03/19/2021 [facility]) IMPRESSION: Mildly motion degraded exam.  No evidence of acute intracranial abnormality.  A known small chronic cortical/subcortical infarct within the paramedian posterior left frontal lobe was better appreciated on the prior brain MRI of 02/05/2021 (acute at that time).  Mild chronic small-vessel ischemic changes within the cerebral white matter, stable.  Redemonstrated small chronic infarcts within the bilateral cerebellar hemispheres.   MR  BRAIN 02/05/2021 MR HEAD/NECK IMPRESSION: 1. Small focus of acute ischemia within the paramedian superior left frontal lobe. No hemorrhage or mass effect. 2. Normal MRA of the head and neck. 3. 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   2D ECHO 02/06/2021 IMPRESSIONS   1. Left ventricular ejection fraction, by estimation, is 55%. The left  ventricle has normal function. The left ventricle has no regional wall  motion abnormalities. Left ventricular diastolic parameters are consistent  with Grade I diastolic dysfunction  (impaired relaxation).   2. Right ventricular systolic function is normal. The right ventricular  size is normal. There is normal pulmonary artery systolic pressure. The  estimated right ventricular systolic pressure is 29.6 mmHg.   3. The mitral valve is normal in structure. Trivial mitral valve  regurgitation. No evidence of mitral stenosis.   4. The aortic valve is tricuspid. Aortic valve regurgitation is trivial.  Mild aortic valve sclerosis is present, with no evidence of aortic valve  stenosis.   5. Aortic dilatation noted. There is mild dilatation of the ascending  aorta, measuring 41 mm.   6. The inferior vena cava is normal in size with greater than 50%  respiratory variability, suggesting right atrial pressure of 3 mmHg.         ROS:   14 system review of systems performed and negative with exception of those listed in HPI  PMH: History reviewed. No pertinent past medical history.  PSH: History reviewed. No pertinent surgical history.  Social History:  Social History  Socioeconomic History   Marital status: Married    Spouse name: Not on file   Number of children: Not on file   Years of education: Not on file   Highest education level: Not on file  Occupational History   Not on file  Tobacco Use   Smoking status: Never   Smokeless tobacco: Never  Substance and Sexual Activity   Alcohol use: Not Currently   Drug use: Not  Currently   Sexual activity: Not on file  Other Topics Concern   Not on file  Social History Narrative   Not on file   Social Determinants of Health   Financial Resource Strain: Not on file  Food Insecurity: Not on file  Transportation Needs: Not on file  Physical Activity: Not on file  Stress: Not on file  Social Connections: Not on file  Intimate Partner Violence: Not on file    Family History: History reviewed. No pertinent family history.  Medications:   Current Outpatient Medications on File Prior to Visit  Medication Sig Dispense Refill   aspirin 81 MG chewable tablet Chew 1 tablet (81 mg total) by mouth daily. 30 tablet 2   atorvastatin (LIPITOR) 80 MG tablet Take 80 mg by mouth daily.     betamethasone valerate ointment (VALISONE) 0.1 % Apply topically as needed.     ondansetron (ZOFRAN) 4 mg TABS tablet      No current facility-administered medications on file prior to visit.    Allergies:  No Known Allergies    OBJECTIVE:  Physical Exam  Vitals:   03/20/21 0759  BP: 121/81  Pulse: (!) 58  Weight: 202 lb (91.6 kg)  Height: 6\' 1"  (1.854 m)   Body mass index is 26.65 kg/m. No results found.  Post stroke PHQ 2/9 Depression screen PHQ 2/9 03/20/2021  Decreased Interest 0  Down, Depressed, Hopeless 0  PHQ - 2 Score 0     General: well developed, well nourished, pleasant elderly Caucasian male, seated, in no evident distress Head: head normocephalic and atraumatic.   Neck: supple with no carotid or supraclavicular bruits Cardiovascular: regular rate and rhythm, no murmurs Musculoskeletal: no deformity Skin:  no rash/petichiae Vascular:  Normal pulses all extremities   Neurologic Exam Mental Status: Awake and fully alert. Fluent speech and language. Oriented to place and time. Recent and remote memory intact. Attention span, concentration and fund of knowledge appropriate. Mood and affect appropriate.  Cranial Nerves: Fundoscopic exam reveals sharp  disc margins. Pupils equal, briskly reactive to light. Extraocular movements full without nystagmus. Visual fields full to confrontation. Hearing intact. Facial sensation intact. Face, tongue, palate moves normally and symmetrically.  Motor: Normal bulk and tone. Normal strength in all tested extremity muscles Sensory.: intact to touch , pinprick , position and vibratory sensation.  Coordination: Rapid alternating movements normal in all extremities. Finger-to-nose and heel-to-shin performed accurately bilaterally. Gait and Station: Arises from chair without difficulty. Stance is normal. Gait demonstrates normal stride length with mild unsteadiness/imbalance. Tandem walk and heel toe with moderate difficulty. Romberg positive.   Reflexes: 1+ and symmetric. Toes downgoing.        ASSESSMENT: Thomas Roberts is a 75 y.o. year old male with recent left frontal lobe infarct, embolic cryptogenic etiology on 02/05/2021 after presenting with acute onset right upper and lower extremity weakness. Vascular risk factors include advanced age and HLD only.  New onset dizziness 8/29 full ED eval unremarkable     PLAN:  Left frontal lobe infarct: Recovered well  without residual deficit. Complete 30-day cardiac event monitor - if negative, consider placement of recorder.  Continue aspirin 81 mg daily for secondary stroke prevention.  Discussed secondary stroke prevention measures and importance of close PCP follow up for aggressive stroke risk factor management. I have gone over the pathophysiology of stroke, warning signs and symptoms, risk factors and their management in some detail with instructions to go to the closest emergency room for symptoms of concern. Vertigo: unknown etiology. MR brain 8/30 no acute findings. Concern of peripheral etiology -referral placed to neuro rehab PT for vestibular rehab.  Further discussed with Dr. Pearlean Brownie who does not feel as though additional work-up or imaging is  indicated at this time.  Continue meclizine and Zofran as needed.  HLD: LDL goal <70. Recent LDL 93. Continue atorvastatin 80 mg daily per PCP    Follow up in 3 months or call earlier if needed   CC:  [facility] provider: Dr. Pearlean Brownie PCP: Brayton Mars, MD    I spent 54 minutes of face-to-face and non-face-to-face time with patient and wife.  This included previsit chart review including review of recent hospitalization, lab review, study review, order entry, electronic health record documentation, patient and wife education regarding recent stroke including etiology, secondary stroke prevention measures and importance of managing stroke risk factors, new onset dizziness and likely etiology and further treatment options and answered all other questions to patient and wife's satisfaction   Ihor Austin, AGNP-BC  [facility] [address] [place], [place] [address]  Phone [phone] Fax [phone] Note: This document was prepared with digital dictation and possible smart phrase technology. Any transcriptional errors that result from this process are unintentional.

## 2021-03-20 ENCOUNTER — Other Ambulatory Visit: Payer: Self-pay

## 2021-03-20 ENCOUNTER — Encounter: Payer: Self-pay | Admitting: Adult Health

## 2021-03-20 ENCOUNTER — Ambulatory Visit: Payer: 59 | Admitting: Adult Health

## 2021-03-20 VITALS — BP 121/81 | HR 58 | Ht 73.0 in | Wt 202.0 lb

## 2021-03-20 DIAGNOSIS — R42 Dizziness and giddiness: Secondary | ICD-10-CM | POA: Diagnosis not present

## 2021-03-20 DIAGNOSIS — I639 Cerebral infarction, unspecified: Secondary | ICD-10-CM

## 2021-03-20 MED ORDER — MECLIZINE HCL 12.5 MG PO TABS: 12.5000 mg | ORAL_TABLET | Freq: Three times a day (TID) | ORAL | 0 refills | Status: DC | PRN

## 2021-03-20 MED ORDER — ONDANSETRON 4 MG PO TBDP: 4.0000 mg | ORAL_TABLET | Freq: Three times a day (TID) | ORAL | 5 refills | Status: DC | PRN

## 2021-03-20 NOTE — Patient Instructions (Addendum)
Referral to PT for vestibular rehab  Continue meclizine and zofran  Complete heart monitor to rule out atrial fibrillation -if negative, would recommend proceeding with loop recorder placement  Continue aspirin 81 mg daily  and atorvastatin 80mg  daily  for secondary stroke prevention  Continue to follow up with PCP regarding cholesterol and blood pressure management  Maintain strict control of hypertension with blood pressure goal below 130/90 and cholesterol with LDL cholesterol (bad cholesterol) goal below 70 mg/dL.   Will follow up via MyChart if further work up/tests are needed      Followup in the future with me in 3 months or call earlier if needed       Thank you for coming to see at [facility]. I hope we have been able to provide you high quality care today.  You may receive a patient satisfaction survey over the next few weeks. We would appreciate your feedback and comments so that we may continue to improve ourselves and the health of our patients.   Benign Positional Vertigo Vertigo is the feeling that you or your surroundings are moving when they are not. Benign positional vertigo is the most common form of vertigo. This is usually a harmless condition (benign). This condition is positional. This means that symptoms are triggered by certain movements and positions. This condition can be dangerous if it occurs while you are doing something that could cause harm to yourself or others. This includes activities such as driving or operating machinery. What are the causes? The inner ear has fluid-filled canals that help your brain sense movement and balance. When the fluid moves, the brain receives messages about your body's position. With benign positional vertigo, calcium crystals in the inner ear break free and disturb the inner ear area. This causes your brain to receive confusing messages about your body's position. What increases the risk? You  are more likely to develop this condition if: You are a woman. You are 4 years of age or older. You have recently had a head injury. You have an inner ear disease. What are the signs or symptoms? Symptoms of this condition usually happen when you move your head or your eyes in different directions. Symptoms may start suddenly and usually last for less than a minute. They include: Loss of balance and falling. Feeling like you are spinning or moving. Feeling like your surroundings are spinning or moving. Nausea and vomiting. Blurred vision. Dizziness. Involuntary eye movement (nystagmus). Symptoms can be mild and cause only minor problems, or they can be severe and interfere with daily life. Episodes of benign positional vertigo may return (recur) over time. Symptoms may also improve over time. How is this diagnosed? This condition may be diagnosed based on: Your medical history. A physical exam of the head, neck, and ears. Positional tests to check for or stimulate vertigo. You may be asked to turn your head and change positions, such as going from sitting to lying down. A health care provider will watch for symptoms of vertigo. You may be referred to a health care provider who specializes in ear, nose, and throat problems (ENT or otolaryngologist) or a provider who specializes in disorders of the nervous system (neurologist). How is this treated? This condition may be treated in a session in which your health care provider moves your head in specific positions to help the displaced crystals in your inner ear move. Treatment for this condition may take several sessions. Surgery may be needed in severe  cases, but this is rare. In some cases, benign positional vertigo may resolve on its own in 2-4 weeks. Follow these instructions at home: Safety Move slowly. Avoid sudden body or head movements or certain positions, as told by your health care provider. Avoid driving or operating machinery  until your health care provider says it is safe. Avoid doing any tasks that would be dangerous to you or others if vertigo occurs. If you have trouble walking or keeping your balance, try using a cane for stability. If you feel dizzy or unstable, sit down right away. Return to your normal activities as told by your health care provider. Ask your health care provider what activities are safe for you. General instructions Take over-the-counter and prescription medicines only as told by your health care provider. Drink enough fluid to keep your urine pale yellow. Keep all follow-up visits. This is important. Contact a health care provider if: You have a fever. Your condition gets worse or you develop new symptoms. Your family or friends notice any behavioral changes. You have nausea or vomiting that gets worse. You have numbness or a prickling and tingling sensation. Get help right away if you: Have difficulty speaking or moving. Are always dizzy or faint. Develop severe headaches. Have weakness in your legs or arms. Have changes in your hearing or vision. Develop a stiff neck. Develop sensitivity to light. These symptoms may represent a serious problem that is an emergency. Do not wait to see if the symptoms will go away. Get medical help right away. Call your local emergency services (911 in the [place] Do not drive yourself to the hospital. Summary Vertigo is the feeling that you or your surroundings are moving when they are not. Benign positional vertigo is the most common form of vertigo. This condition is caused by calcium crystals in the inner ear that become displaced. This causes a disturbance in an area of the inner ear that helps your brain sense movement and balance. Symptoms include loss of balance and falling, feeling that you or your surroundings are moving, nausea and vomiting, and blurred vision. This condition can be diagnosed based on symptoms, a physical exam, and  positional tests. Follow safety instructions as told by your health care provider and keep all follow-up visits. This is important. This information is not intended to replace advice given to you by your health care provider. Make sure you discuss any questions you have with your health care provider. Document Revised: 06/06/2020 Document Reviewed: 06/06/2020 Elsevier Patient Education  2022 [employer].

## 2021-03-21 ENCOUNTER — Encounter: Payer: Self-pay | Admitting: Adult Health

## 2021-03-26 ENCOUNTER — Ambulatory Visit: Payer: 59 | Attending: Adult Health | Admitting: Physical Therapy

## 2021-03-26 ENCOUNTER — Encounter: Payer: Self-pay | Admitting: Physical Therapy

## 2021-03-26 ENCOUNTER — Other Ambulatory Visit: Payer: Self-pay

## 2021-03-26 DIAGNOSIS — R42 Dizziness and giddiness: Secondary | ICD-10-CM | POA: Diagnosis present

## 2021-03-26 NOTE — Therapy (Signed)
[facility] [address] [place], [place], [address] Phone: [phone]   Fax:  [phone]  Physical Therapy Evaluation  Patient Details  Name: Thomas Roberts MRN: [id number] Date of Birth: Oct 11, 1945 Referring Provider (PT): Ihor Austin, NP   Encounter Date: 03/26/2021   PT End of Session - 03/26/21 0949     Visit Number 1    Number of Visits 1    Date for PT Re-Evaluation 03/26/21    Authorization Type $35 copay, [employer]    Authorization - Number of Visits 30    PT Start Time 0807    PT Stop Time 0840    PT Time Calculation (min) 33 min    Activity Tolerance Patient tolerated treatment well    Behavior During Therapy [facility] for tasks assessed/performed             History reviewed. No pertinent past medical history.  History reviewed. No pertinent surgical history.  There were no vitals filed for this visit.    Subjective Assessment - 03/26/21 0809     Subjective Pt returned from [place], played pick up soccer and drove to a business meeting in [place].  Began to feel really tired/weak and went for a massage.  After the massage pt began to spin; thought he was having another TIA.  Went to the ED, no significant findings; prescribed Antivert and Zofran.  Feels back to normal.  No spinning since initial onset.    Pertinent History acute CVA    Diagnostic tests CT and MRI - no acute abnormalities    Patient Stated Goals To make sure everything is clear    Currently in Pain? No/denies                [facility] PT Assessment - 03/26/21 0813       Assessment   Medical Diagnosis Vertigo    Referring Provider (PT) Ihor Austin, NP    Onset Date/Surgical Date 03/18/21    Prior Therapy not for dizziness      Precautions   Precautions None      Balance Screen   Has the patient fallen in the past 6 months No      Prior Function   Level of Independence Independent    Garment/textile technologist    Leisure adult league soccer      Cognition   Overall Cognitive Status Within Functional Limits for tasks assessed      Observation/Other Assessments   Focus on Therapeutic Outcomes (FOTO)  DFS: 61; DPS: 69.8 - closed today due to no impairments identified      Coordination   Gross Motor Movements are Fluid and Coordinated Yes    Finger Nose Finger Test WFL      ROM / Strength   AROM / PROM / Strength Strength      Strength   Overall Strength Within functional limits for tasks performed   back to baseline after CVA     Transfers   Transfers Sit to Stand;Stand to Sit    Sit to Stand 7: Independent    Stand to Sit 7: Independent      Ambulation/Gait   Ambulation/Gait Yes    Ambulation/Gait Assistance 7: Independent                    Vestibular Assessment - 03/26/21 0815       Symptom Behavior   Subjective history of current problem Denies changes to vision  or hearing.  Denies neck pain or HA.  Did vomit one time initially but not since initial onset.    Type of Dizziness  Spinning    Frequency of Dizziness no longer occuring    Duration of Dizziness Began when he got up from the massage; was constant    Symptom Nature Constant    Aggravating Factors Spontaneous onset    Relieving Factors Comments   resolved   Progression of Symptoms Better      Oculomotor Exam   Oculomotor Alignment Normal    Ocular ROM WFL    Spontaneous Absent    Gaze-induced  Absent    Smooth Pursuits Intact    Saccades Intact      Oculomotor Exam-Fixation Suppressed    Left Head Impulse negative    Right Head Impulse negative      Vestibulo-Ocular Reflex   VOR to Slow Head Movement Normal    VOR Cancellation Normal      Positional Testing   Dix-Hallpike Dix-Hallpike Right;Dix-Hallpike Left    Horizontal Canal Testing Horizontal Canal Right;Horizontal Canal Left      Dix-Hallpike Right   Dix-Hallpike Right Duration 0    Dix-Hallpike Right Symptoms No  nystagmus      Dix-Hallpike Left   Dix-Hallpike Left Duration 0    Dix-Hallpike Left Symptoms No nystagmus      Horizontal Canal Right   Horizontal Canal Right Duration 0    Horizontal Canal Right Symptoms Normal      Horizontal Canal Left   Horizontal Canal Left Duration 0    Horizontal Canal Left Symptoms Normal      Positional Sensitivities   Sit to Supine No dizziness    Supine to Left Side No dizziness    Supine to Right Side No dizziness    Supine to Sitting No dizziness    Right Hallpike No dizziness    Up from Right Hallpike No dizziness    Up from Left Hallpike No dizziness    Nose to Right Knee No dizziness    Right Knee to Sitting No dizziness    Nose to Left Knee No dizziness    Left Knee to Sitting No dizziness    Head Turning x 5 No dizziness    Head Nodding x 5 No dizziness    Pivot Right in Standing No dizziness    Pivot Left in Standing No dizziness    Rolling Right No dizziness    Rolling Left No dizziness                Objective measurements completed on examination: See above findings.               PT Education - 03/26/21 0948     Education Details no evidence of vestibular impairments peripheral or central; no need for PT follow up at this time.  Indications to return to therapy    Person(s) Educated Patient    Methods Explanation    Comprehension Verbalized understanding                         Plan - 03/26/21 0949     Clinical Impression Statement Pt is a 75 year old male referred to Neuro OPPT for evaluation of vertigo and recent L frontal CVA.  Pt's PMH is significant for the following: acute CVA. The following deficits were noted during pt's exam: no significant impairments or functional limitations were identified today.  Pt demonstrated  normal vestibular and oculomotor function and was negative for positional vertigo.  Unable to elicit any symptoms and pt feels he is back to baseline.  No follow up PT  indicated at this time.    Personal Factors and Comorbidities Profession    Examination-Activity Limitations Other   flying   Examination-Participation Restrictions Community Activity    Stability/Clinical Decision Making Stable/Uncomplicated    Clinical Decision Making Low    Rehab Potential Excellent    PT Frequency One time visit    PT Duration --   1 time visit for eval only   Consulted and Agree with Plan of Care Patient             Patient will benefit from skilled therapeutic intervention in order to improve the following deficits and impairments:  Dizziness  Visit Diagnosis: Dizziness and giddiness     Problem List Patient Active Problem List   Diagnosis Date Noted   Acute CVA (cerebrovascular accident) (HCC) 02/05/2021    Dierdre Highman, PT, DPT 03/26/21    9:55 AM    [facility] [address] [place], [place], [address] Phone: [phone]   Fax:  [phone]  Name: Thomas Roberts MRN: [id number] Date of Birth: Sep 16, 1945

## 2021-04-10 ENCOUNTER — Telehealth: Payer: Self-pay | Admitting: Cardiology

## 2021-04-10 NOTE — Telephone Encounter (Signed)
Patient calling in because he received the heart monitor in the mail, but he states he doesn't wont to wear it because of the size. Patient wants to know if there is another option. Please advise

## 2021-04-10 NOTE — Telephone Encounter (Signed)
Pt is calling to let Katrina Stack PA-C and CMA know that he received his 30 day event monitor (zio AT) in the mail about 10 days ago, and has not placed this device on yet, and is not wanting to wear this device at all. Pt states he feels like he cannot wear the zio for 30 days, and Mardelle Matte mentioned at is last OV, that having a loop recorder implanted could be an option, however, his insurance will more than likely require the zio before proceeding with a loop. Pt is asking Mardelle Matte if he can proceed with getting his loop implanted vs wearing the event monitor.  Pt states he still has the monitor in his possession, and has decided he will not wear this. Pt is asking for [place] to send this message to Mardelle Matte to ask if he could proceed with getting the pt set up to have an ILR done and he will mail the event monitor back to the company. Pt education provided on the event monitor, but pt still refusing.  Informed the pt that I will route this message to Otilio Saber PA-C and his CMA for further review and recommendation, then follow-up with the pt accordingly thereafter.  Pt verbalized understanding and agrees with this plan.  Off note:  Pt was ordered to have the event monitor to further assess for afib, for he had recent CVA.

## 2021-04-10 NOTE — Telephone Encounter (Signed)
Message sent to Pre-cert dept for Loop Recorder Implant.

## 2021-04-29 ENCOUNTER — Encounter: Payer: Self-pay | Admitting: Cardiology

## 2021-04-29 ENCOUNTER — Other Ambulatory Visit: Payer: Self-pay

## 2021-04-29 ENCOUNTER — Ambulatory Visit: Payer: 59 | Admitting: Cardiology

## 2021-04-29 VITALS — BP 128/80 | HR 61 | Ht 73.0 in | Wt 206.8 lb

## 2021-04-29 DIAGNOSIS — I639 Cerebral infarction, unspecified: Secondary | ICD-10-CM

## 2021-04-29 NOTE — Progress Notes (Signed)
Electrophysiology Office Note   Date:  04/29/2021   ID:  Thomas Roberts, DOB January 04, 1946, MRN [id number]  PCP:  Brayton Mars, MD  Cardiologist:   Primary Electrophysiologist:  Ivan Lacher [name], MD    Chief Complaint: CVA   History of Present Illness: Thomas Roberts is a 75 y.o. male who is being seen today for the evaluation of CVA at the request of No ref. provider found. Presenting today for electrophysiology evaluation.  He has a history significant for hyperlipidemia, obstructive sleep apnea, and cryptogenic stroke.  He presented to the hospital 02/05/2021 with sudden onset right flank weakness and dizziness.  MRI brain showed acute ischemia of the left frontal lobe.  No cause has been found for his cryptogenic stroke.  Today, he denies symptoms of palpitations, chest pain, shortness of breath, orthopnea, PND, lower extremity edema, claudication, dizziness, presyncope, syncope, bleeding, or neurologic sequela. The patient is tolerating medications without difficulties.  Since his stroke he has done well.  He has had no chest pain or shortness of breath.  He is in no to do all of his daily activities without restriction.  He continues to work and exercise.   Past Medical History:  Diagnosis Date   Hx of TIA (transient ischemic attack) and stroke    Hx of vertigo    Past Surgical History:  Procedure Laterality Date   KNEE SURGERY Right    TONSILLECTOMY       Current Outpatient Medications  Medication Sig Dispense Refill   aspirin 81 MG chewable tablet Chew 1 tablet (81 mg total) by mouth daily. 30 tablet 2   betamethasone valerate ointment (VALISONE) 0.1 % Apply topically as needed.     No current facility-administered medications for this visit.    Allergies:   Patient has no known allergies.   Social History:  The patient  reports that he has never smoked. He has never used smokeless tobacco. He reports that he does not currently use alcohol. He  reports that he does not currently use drugs.   Family History:  The patient's family history includes Heart disease in his father, paternal grandfather, and paternal grandmother.    ROS:  Please see the history of present illness.   Otherwise, review of systems is positive for none.   All other systems are reviewed and negative.    PHYSICAL EXAM: VS:  BP 128/80   Pulse 61   Ht 6\' 1"  (1.854 m)   Wt 206 lb 12.8 oz (93.8 kg)   SpO2 96%   BMI 27.28 kg/m  , BMI Body mass index is 27.28 kg/m. GEN: Well nourished, well developed, in no acute distress  HEENT: normal  Neck: no JVD, carotid bruits, or masses Cardiac: RRR; no murmurs, rubs, or gallops,no edema  Respiratory:  clear to auscultation bilaterally, normal work of breathing GI: soft, nontender, nondistended, + BS MS: no deformity or atrophy  Skin: warm and dry Neuro:  Strength and sensation are intact Psych: euthymic mood, full affect  EKG:  EKG is ordered today. Personal review of the ekg ordered shows sinus rhythm, rate 61  Recent Labs: 02/05/2021: ALT 25; BUN 44; Creatinine, Ser 1.30; Hemoglobin 16.7; Platelets 213; Sodium 140 02/06/2021: Potassium 4.1    Lipid Panel     Component Value Date/Time   CHOL 147 02/06/2021 0339   TRIG 120 02/06/2021 0339   HDL 56 02/06/2021 0339   CHOLHDL 2.6 02/06/2021 0339   VLDL 24 02/06/2021 0339   LDLCALC  67 02/06/2021 0339   LDLDIRECT 86.5 02/05/2021 1809     Wt Readings from Last 3 Encounters:  04/29/21 206 lb 12.8 oz (93.8 kg)  03/20/21 202 lb (91.6 kg)  02/25/21 205 lb (93 kg)      Other studies Reviewed: Additional studies/ records that were reviewed today include: TTE 02/06/21  Review of the above records today demonstrates:   1. Left ventricular ejection fraction, by estimation, is 55%. The left  ventricle has normal function. The left ventricle has no regional wall  motion abnormalities. Left ventricular diastolic parameters are consistent  with Grade I diastolic  dysfunction  (impaired relaxation).   2. Right ventricular systolic function is normal. The right ventricular  size is normal. There is normal pulmonary artery systolic pressure. The  estimated right ventricular systolic pressure is 29.6 mmHg.   3. The mitral valve is normal in structure. Trivial mitral valve  regurgitation. No evidence of mitral stenosis.   4. The aortic valve is tricuspid. Aortic valve regurgitation is trivial.  Mild aortic valve sclerosis is present, with no evidence of aortic valve  stenosis.   5. Aortic dilatation noted. There is mild dilatation of the ascending  aorta, measuring 41 mm.   6. The inferior vena cava is normal in size with greater than 50%  respiratory variability, suggesting right atrial pressure of 3 mmHg.    ASSESSMENT AND PLAN:  1.  Cryptogenic stroke: Has had a work-up with neurology that showed no obvious cause.  He did not require TEE due to age.  He would benefit from further monitoring for atrial fibrillation.  Most appropriate monitoring [place] Gavitt be with Linq monitor.  Risk and benefits of been discussed.  Risk include bleeding and infection.  The patient understand these risks and has agreed to the procedure.    Current medicines are reviewed at length with the patient today.   The patient does not have concerns regarding his medicines.  The following changes were made today:  none  Labs/ tests ordered today include:  Orders Placed This Encounter  Procedures   EKG 12-Lead      Disposition:   FU with [name] pending link monitor results  Signed, Mykia Holton [name], MD  04/29/2021 3:12 PM     [facility] [address] [place] [address] [phone] (office) [phone] (fax)   SURGEON:  Loman Brooklyn, MD     PREPROCEDURE DIAGNOSIS:  Cryptogenic Stroke    POSTPROCEDURE DIAGNOSIS:  Cryptogenic Stroke     PROCEDURES:   1. Implantable loop recorder implantation    INTRODUCTION:  Thomas Roberts is a 75 y.o. male with a history of unexplained stroke who presents today for implantable loop implantation.  The patient has had a cryptogenic stroke.  Despite an extensive workup by neurology, no reversible causes have been identified.  he has worn telemetry during which he did not have arrhythmias.  There is significant concern for possible atrial fibrillation as the cause for the patients stroke.  The patient therefore presents today for implantable loop implantation.     DESCRIPTION OF PROCEDURE:  Informed written consent was obtained, and the patient was brought to the electrophysiology lab in a fasting state.  The patient required no sedation for the procedure today.  Mapping over the patient's chest was performed by the EP lab staff to identify the area where electrograms were most prominent for ILR recording.  This area was found to be the left parasternal region over the 3rd-4th  intercostal space. The patients left chest was therefore prepped and draped in the usual sterile fashion by the EP lab staff. The skin overlying the left parasternal region was infiltrated with lidocaine for local analgesia.  A 0.5-cm incision was made over the left parasternal region over the 3rd intercostal space.  A subcutaneous ILR pocket was fashioned using a combination of sharp and blunt dissection.  A Medtronic Reveal Linq model LINQ SN RLA [id number] implantable loop recorder was then placed into the pocket  R waves were very prominent and measured 0.56 mV. EBL<1 ml.  Steri- Strips and a sterile dressing were then applied.  There were no early apparent complications.     CONCLUSIONS:   1. Successful implantation of a Medtronic Reveal LINQ implantable loop recorder for cryptogenic stroke  2. No early apparent complications.

## 2021-04-29 NOTE — Patient Instructions (Signed)
Medication Instructions:  Your physician recommends that you continue on your current medications as directed. Please refer to the Current Medication list given to you today.  *If you need a refill on your cardiac medications before your next appointment, please call your pharmacy*   Lab Work: None ordered  Testing/Procedures: None ordered   Follow-Up: At [facility], you and your health needs are our priority.  As part of our continuing mission to provide you with exceptional heart care, we have created designated Provider Care Teams.  These Care Teams include your primary Cardiologist (physician) and Advanced Practice Providers (APPs -  Physician Assistants and Nurse Practitioners) who all work together to provide you with the care you need, when you need it.  Your next appointment:   As   needed  The format for your next appointment:   In Person  Provider:   Will Camnitz, MD    Thank you for choosing [facility]!!   Edythe Riches, RN [phone]   Other Instructions   Device clinic   [phone] 

## 2021-06-03 ENCOUNTER — Ambulatory Visit (INDEPENDENT_AMBULATORY_CARE_PROVIDER_SITE_OTHER): Payer: 59

## 2021-06-03 DIAGNOSIS — I639 Cerebral infarction, unspecified: Secondary | ICD-10-CM

## 2021-06-03 LAB — CUP PACEART REMOTE DEVICE CHECK
Date Time Interrogation Session: 20221113073452
Implantable Pulse Generator Implant Date: 20221010

## 2021-06-05 NOTE — Progress Notes (Signed)
Order will be cancelled. Patient refused

## 2021-06-10 NOTE — Progress Notes (Signed)
Carelink Summary Report / Loop Recorder 

## 2021-06-19 ENCOUNTER — Encounter: Payer: Self-pay | Admitting: Adult Health

## 2021-06-19 ENCOUNTER — Ambulatory Visit (INDEPENDENT_AMBULATORY_CARE_PROVIDER_SITE_OTHER): Payer: 59 | Admitting: Adult Health

## 2021-06-19 VITALS — BP 129/85 | HR 61 | Ht 73.0 in | Wt 208.0 lb

## 2021-06-19 DIAGNOSIS — I639 Cerebral infarction, unspecified: Secondary | ICD-10-CM

## 2021-06-19 DIAGNOSIS — H81399 Other peripheral vertigo, unspecified ear: Secondary | ICD-10-CM

## 2021-06-19 NOTE — Progress Notes (Signed)
[facility] [address] [place]. [place] [address] [phone]       STROKE FOLLOW UP NOTE  [name] Date of Birth:  [birth year] Medical Record Number:  [id number]   Reason for Referral:  stroke follow up    SUBJECTIVE:   CHIEF COMPLAINT:  Chief Complaint  Patient presents with   Ischemic stroke    Rm 3, 3 month FU "no more episodes of vertigo, no new concerns"     HPI:   Mr. Tukker Rexroad is a 75 y.o. 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 Recommended loop recorder to evaluate for possible A. Fib - referral to EP to discuss outpatient. LDL 67.  A1c 5.3.  Recommended DAPT for 3 weeks followed by aspirin alone.  Other stroke risk factors include advanced age.  PT/OT no therapy needs.  Presented to [facility] 03/18/2021 after an episode of dizziness after receiving a full body massage. Noted to have horizontal left-sided nystagmus as well as mild vertical nystagmus.  MRI negative for acute stroke.  Discharge with meclizine and Zofran and discussed benefit from a referral for outpatient PT for BPPV.   Initial office visit 03/20/2021 doing well without any residual stroke deficits or new stroke/TIA symptoms.  No recurrence of vertigo or dizziness. Reported compliance on aspirin and was started on atorvastatin 80 mg daily by PCP as LDL 93 (8/17).  Blood pressure today 121/81. Plans on starting cardiac monitor next week for 30 days.    Update 06/19/2021 JM: Returns for 64-month stroke follow-up.  Overall stable.  Denies new or reoccurring stroke/TIA symptoms.  He has returned back to all prior activities and is very active.  No additional vertigo type symptoms - eval by PT for vestibular rehab without evidence of peripheral or central impairments -- no further PT needs.  Compliant on aspirin -denies side effects. Has since stopped atorvastatin - he has been making dietary changes and plan to repeat lipid panel in Feb with PCP.  Blood pressure today 129/85. S/p ILR placement 04/29/2021 which has not shown atrial fibrillation thus far. No new concerns at this time     PERTINENT IMAGING  MR BRAIN 03/19/2021 [facility]) IMPRESSION: Mildly motion degraded exam.  No evidence of acute intracranial abnormality.  A known small chronic cortical/subcortical infarct within the paramedian posterior left frontal lobe was better appreciated on the prior brain MRI of 02/05/2021 (acute at that time).  Mild chronic small-vessel ischemic changes within the cerebral white matter, stable.  Redemonstrated small chronic infarcts within the bilateral cerebellar hemispheres.   MR BRAIN 02/05/2021 MR HEAD/NECK IMPRESSION: 1. Small focus of acute ischemia within the paramedian superior left frontal lobe. No hemorrhage or mass effect. 2. Normal MRA of the head and neck. 3. 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   2D ECHO 02/06/2021 IMPRESSIONS   1. Left ventricular ejection fraction, by estimation, is 55%. The left  ventricle has normal function. The left ventricle has no regional wall  motion abnormalities. Left ventricular diastolic parameters are consistent  with Grade I diastolic dysfunction  (impaired relaxation).   2. Right ventricular systolic function is normal. The right ventricular  size is normal. There is normal pulmonary artery systolic pressure. The  estimated right ventricular systolic pressure is 29.6 mmHg.   3. The mitral valve is  normal in structure. Trivial mitral valve  regurgitation. No evidence of mitral stenosis.   4. The aortic valve is tricuspid. Aortic valve regurgitation is trivial.  Mild aortic valve sclerosis is present, with no evidence of aortic valve  stenosis.   5. Aortic dilatation noted. There is mild dilatation  of the ascending  aorta, measuring 41 mm.   6. The inferior vena cava is normal in size with greater than 50%  respiratory variability, suggesting right atrial pressure of 3 mmHg.         ROS:   14 system review of systems performed and negative with exception of those listed in HPI  PMH:  Past Medical History:  Diagnosis Date   Hx of TIA (transient ischemic attack) and stroke    Hx of vertigo     PSH:  Past Surgical History:  Procedure Laterality Date   KNEE SURGERY Right    TONSILLECTOMY      Social History:  Social History   Socioeconomic History   Marital status: Married    Spouse name: Not on file   Number of children: Not on file   Years of education: Not on file   Highest education level: Not on file  Occupational History   Not on file  Tobacco Use   Smoking status: Never   Smokeless tobacco: Never  Substance and Sexual Activity   Alcohol use: Not Currently   Drug use: Not Currently   Sexual activity: Not on file  Other Topics Concern   Not on file  Social History Narrative   Not on file   Social Determinants of Health   Financial Resource Strain: Not on file  Food Insecurity: Not on file  Transportation Needs: Not on file  Physical Activity: Not on file  Stress: Not on file  Social Connections: Not on file  Intimate Partner Violence: Not on file    Family History:  Family History  Problem Relation Age of Onset   Heart disease Father    Heart disease Paternal Grandmother    Heart disease Paternal Grandfather     Medications:   No current outpatient medications on file prior to visit.   No current facility-administered medications on file prior to visit.    Allergies:  No Known Allergies    OBJECTIVE:  Physical Exam  Vitals:   06/19/21 0948  BP: 129/85  Pulse: 61  Weight: 208 lb (94.3 kg)  Height: 6\' 1"  (1.854 m)    Body mass index is 27.44 kg/m. No results found.  General: well developed, well nourished, pleasant  elderly Caucasian male, seated, in no evident distress Head: head normocephalic and atraumatic.   Neck: supple with no carotid or supraclavicular bruits Cardiovascular: regular rate and rhythm, no murmurs Musculoskeletal: no deformity Skin:  no rash/petichiae Vascular:  Normal pulses all extremities   Neurologic Exam Mental Status: Awake and fully alert. Fluent speech and language. Oriented to place and time. Recent and remote memory intact. Attention span, concentration and fund of knowledge appropriate. Mood and affect appropriate.  Cranial Nerves: Pupils equal, briskly reactive to light. Extraocular movements full without nystagmus. Visual fields full to confrontation. Hearing intact. Facial sensation intact. Face, tongue, palate moves normally and symmetrically.  Motor: Normal bulk and tone. Normal strength in all tested extremity muscles Sensory.: intact to touch , pinprick , position and vibratory sensation.  Coordination: Rapid alternating movements normal in all extremities. Finger-to-nose and heel-to-shin performed accurately bilaterally. Gait and Station: Arises from chair without difficulty. Stance is  normal. Gait demonstrates normal stride length and balance.  Tandem walk and heel toe without difficulty. Romberg negative.   Reflexes: 1+ and symmetric. Toes downgoing.        ASSESSMENT: Ardith Lewman Born is a 75 y.o. year old male with recent left frontal lobe infarct, embolic cryptogenic etiology on 02/05/2021 without residual deficits.  Vascular risk factors include advanced age and HLD only.  New onset transient dizziness 03/18/2021 with full ER eval unremarkable possibly BPPV.      PLAN:  Left frontal lobe infarct:   S/p ILR 04/29/2021 - no evidence of A fib thus far.   Continue aspirin 81 mg daily for secondary stroke prevention.   Discussed secondary stroke prevention measures and importance of close PCP follow up for aggressive stroke risk factor management. I have  gone over the pathophysiology of stroke, warning signs and symptoms, risk factors and their management in some detail with instructions to go to the closest emergency room for symptoms of concern. Episodic vertigo: unknown etiology. MR brain 8/30 no acute findings. No additional events. Continue to monitor HLD: LDL goal <70. prior LDL 93. Has been making dietary changes and plans on repeat lipid panel with PCP in February in hopes of avoiding statin    Follow up in 6 months or call earlier if needed   CC:  PCP: Brayton Mars, MD    I spent 28 minutes of face-to-face and non-face-to-face time with patient.  This included previsit chart review, lab review, study review, electronic health record documentation, patient and wife education regarding prior stroke including etiology, secondary stroke prevention measures and importance of managing stroke risk factors, prior transient episode of vertigo and answered all other questions to patient satisfaction  Ihor Austin, [facility]  [facility] [address] [place], [place] [address]  Phone [phone] Fax [phone] Note: This document was prepared with digital dictation and possible smart phrase technology. Any transcriptional errors that result from this process are unintentional.

## 2021-06-19 NOTE — Patient Instructions (Addendum)
Continue aspirin 81 mg daily for secondary stroke prevention  Continue to follow up with PCP regarding cholesterol and blood pressure management  Maintain strict control cholesterol with LDL cholesterol (bad cholesterol) goal below 70 mg/dL.   Use of Meclizine as needed if vertigo should return      Followup in the future with me in 6 months or call earlier if needed       Thank you for coming to see [place] at [facility]. I hope we have been able to provide you high quality care today.  You may receive a patient satisfaction survey over the next few weeks. We would appreciate your feedback and comments so that we may continue to improve ourselves and the health of our patients.   How to Perform the Epley Maneuver The Epley maneuver is an exercise that relieves symptoms of vertigo. Vertigo is the feeling that you or your surroundings are moving when they are not. When you feel vertigo, you may feel like the room is spinning and may have trouble walking. The Epley maneuver is used for a type of vertigo caused by a calcium deposit in a part of the inner ear. The maneuver involves changing head positions to help the deposit move out of the area. You can do this maneuver at home whenever you have symptoms of vertigo. You can repeat it in 24 hours if your vertigo has not gone away. Even though the Epley maneuver may relieve your vertigo for a few weeks, it is possible that your symptoms will return. This maneuver relieves vertigo, but it does not relieve dizziness. What are the risks? If it is done correctly, the Epley maneuver is considered safe. Sometimes it can lead to dizziness or nausea that goes away after a short time. If you develop other symptoms--such as changes in vision, weakness, or numbness--stop doing the maneuver and call your health care provider. Supplies needed: A bed or table. A pillow. How to do the Epley maneuver   Sit on the edge of a bed or table  with your back straight and your legs extended or hanging over the edge of the bed or table. Turn your head halfway toward the affected ear or side as told by your health care provider. Lie backward quickly with your head turned until you are lying flat on your back. Your head should dangle (head-hanging position). You may want to position a pillow under your shoulders. Hold this position for at least 30 seconds. If you feel dizzy or have symptoms of vertigo, continue to hold the position until the symptoms stop. Turn your head to the opposite direction until your unaffected ear is facing down. Your head should continue to dangle. Hold this position for at least 30 seconds. If you feel dizzy or have symptoms of vertigo, continue to hold the position until the symptoms stop. Turn your whole body to the same side as your head so that you are positioned on your side. Your head will now be nearly facedown and no longer needs to dangle. Hold for at least 30 seconds. If you feel dizzy or have symptoms of vertigo, continue to hold the position until the symptoms stop. Sit back up. You can repeat the maneuver in 24 hours if your vertigo does not go away. Follow these instructions at home: For 24 hours after doing the Epley maneuver: Keep your head in an upright position. When lying down to sleep or rest, keep your head raised (elevated) with two or  more pillows. Avoid excessive neck movements. Activity Do not drive or use machinery if you feel dizzy. After doing the Epley maneuver, return to your normal activities as told by your health care provider. Ask your health care provider what activities are safe for you. General instructions Drink enough fluid to keep your urine pale yellow. Do not drink alcohol. Take over-the-counter and prescription medicines only as told by your health care provider. Keep all follow-up visits. This is important. Preventing vertigo symptoms Ask your health care provider if  there is anything you should do at home to prevent vertigo. He or she may recommend that you: Keep your head elevated with two or more pillows while you sleep. Do not sleep on the side of your affected ear. Get up slowly from bed. Avoid sudden movements during the day. Avoid extreme head positions or movement, such as looking up or bending over. Contact a health care provider if: Your vertigo gets worse. You have other symptoms, including: Nausea. Vomiting. Headache. Get help right away if you: Have vision changes. Have a headache or neck pain that is severe or getting worse. Cannot stop vomiting. Have new numbness or weakness in any part of your body. These symptoms may represent a serious problem that is an emergency. Do not wait to see if the symptoms will go away. Get medical help right away. Call your local emergency services (911 in the [place] Do not drive yourself to the hospital. Summary Vertigo is the feeling that you or your surroundings are moving when they are not. The Epley maneuver is an exercise that relieves symptoms of vertigo. If the Epley maneuver is done correctly, it is considered safe. This information is not intended to replace advice given to you by your health care provider. Make sure you discuss any questions you have with your health care provider. Document Revised: 06/06/2020 Document Reviewed: 06/06/2020 Elsevier Patient Education  2022 [employer].

## 2021-07-05 LAB — CUP PACEART REMOTE DEVICE CHECK
Date Time Interrogation Session: 20221216073345
Implantable Pulse Generator Implant Date: 20221010

## 2021-07-08 ENCOUNTER — Ambulatory Visit (INDEPENDENT_AMBULATORY_CARE_PROVIDER_SITE_OTHER): Payer: 59

## 2021-07-08 DIAGNOSIS — I639 Cerebral infarction, unspecified: Secondary | ICD-10-CM

## 2021-07-17 NOTE — Progress Notes (Signed)
Carelink Summary Report / Loop Recorder 

## 2021-08-12 ENCOUNTER — Ambulatory Visit (INDEPENDENT_AMBULATORY_CARE_PROVIDER_SITE_OTHER): Payer: 59

## 2021-08-12 DIAGNOSIS — I639 Cerebral infarction, unspecified: Secondary | ICD-10-CM

## 2021-08-12 LAB — CUP PACEART REMOTE DEVICE CHECK
Date Time Interrogation Session: 20230122231159
Implantable Pulse Generator Implant Date: 20221010

## 2021-08-22 NOTE — Progress Notes (Signed)
Carelink Summary Report / Loop Recorder 

## 2021-09-13 LAB — CUP PACEART REMOTE DEVICE CHECK
Date Time Interrogation Session: 20230224230852
Implantable Pulse Generator Implant Date: 20221010

## 2021-09-16 ENCOUNTER — Ambulatory Visit (INDEPENDENT_AMBULATORY_CARE_PROVIDER_SITE_OTHER): Payer: 59

## 2021-09-16 DIAGNOSIS — I639 Cerebral infarction, unspecified: Secondary | ICD-10-CM

## 2021-09-19 NOTE — Progress Notes (Signed)
Carelink Summary Report / Loop Recorder 

## 2021-10-21 ENCOUNTER — Ambulatory Visit (INDEPENDENT_AMBULATORY_CARE_PROVIDER_SITE_OTHER): Payer: 59

## 2021-10-21 DIAGNOSIS — I639 Cerebral infarction, unspecified: Secondary | ICD-10-CM

## 2021-10-21 LAB — CUP PACEART REMOTE DEVICE CHECK
Date Time Interrogation Session: 20230331230738
Implantable Pulse Generator Implant Date: 20221010

## 2021-11-01 NOTE — Progress Notes (Signed)
Carelink Summary Report / Loop Recorder 

## 2021-11-21 LAB — CUP PACEART REMOTE DEVICE CHECK
Date Time Interrogation Session: 20230503231313
Implantable Pulse Generator Implant Date: 20221010

## 2021-11-25 ENCOUNTER — Ambulatory Visit (INDEPENDENT_AMBULATORY_CARE_PROVIDER_SITE_OTHER): Payer: 59

## 2021-11-25 DIAGNOSIS — I639 Cerebral infarction, unspecified: Secondary | ICD-10-CM

## 2021-12-06 ENCOUNTER — Telehealth: Payer: Self-pay

## 2021-12-06 NOTE — Telephone Encounter (Signed)
The patient needed help setting his app up. I tried to help him but he wanted to finish the process later.

## 2021-12-11 NOTE — Progress Notes (Signed)
Carelink Summary Report / Loop Recorder 

## 2021-12-18 ENCOUNTER — Ambulatory Visit: Payer: Medicare Other | Admitting: Adult Health

## 2021-12-30 ENCOUNTER — Ambulatory Visit (INDEPENDENT_AMBULATORY_CARE_PROVIDER_SITE_OTHER): Payer: 59

## 2021-12-30 DIAGNOSIS — I639 Cerebral infarction, unspecified: Secondary | ICD-10-CM

## 2021-12-31 LAB — CUP PACEART REMOTE DEVICE CHECK
Date Time Interrogation Session: 20230605231522
Implantable Pulse Generator Implant Date: 20221010

## 2022-01-08 ENCOUNTER — Ambulatory Visit: Payer: 59 | Admitting: Adult Health

## 2022-01-08 ENCOUNTER — Encounter: Payer: Self-pay | Admitting: Adult Health

## 2022-01-08 VITALS — BP 134/85 | HR 57 | Ht 73.0 in | Wt 209.0 lb

## 2022-01-08 DIAGNOSIS — R42 Dizziness and giddiness: Secondary | ICD-10-CM

## 2022-01-08 DIAGNOSIS — I639 Cerebral infarction, unspecified: Secondary | ICD-10-CM

## 2022-01-08 NOTE — Patient Instructions (Addendum)
YOU ARE DOING GREAT FROM A STROKE STANDPOINT! FROM A STROKE STANDPOINT, YOU DO NOT HAVE ANY LIMITATIONS OR RESTRICTIONS   Would highly recommend restarting aspirin 81 mg daily  for secondary stroke prevention measures   Continue to follow with cardiology for loop recorder monitoring   Continue to follow with PCP for aggressive stroke risk factor management management   Signs of a Stroke? Follow the BEFAST method:  Balance Watch for a sudden loss of balance, trouble with coordination or vertigo Eyes Is there a sudden loss of vision in one or both eyes? Or double vision?  Face: Ask the person to smile. Does one side of the face droop or is it numb?  Arms: Ask the person to raise both arms. Does one arm drift downward? Is there weakness or numbness of a leg? Speech: Ask the person to repeat a simple phrase. Does the speech sound slurred/strange? Is the person confused ? Time: If you observe any of these signs, call 911.        Thank you for coming to see [place] at [facility]. I hope we have been able to provide you high quality care today.  You may receive a patient satisfaction survey over the next few weeks. We would appreciate your feedback and comments so that we may continue to improve ourselves and the health of our patients.

## 2022-01-08 NOTE — Progress Notes (Signed)
[facility] [address] [place]. Thomas Roberts [id number] [phone]       STROKE FOLLOW UP NOTE  [name] Date of Birth:  07-29-1945 Medical Record Number:  [id number]   Reason for Referral:  stroke follow up    SUBJECTIVE:   CHIEF COMPLAINT:  Chief Complaint  Patient presents with   Follow-up    Rm 2 alone Pt is well and stable, no new concerns      HPI:   [name] is a 76 y.o. 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 Recommended loop recorder to evaluate for possible A. Fib - referral to EP to discuss outpatient. LDL 67.  A1c 5.3.  Recommended DAPT for 3 weeks followed by aspirin alone.  Other stroke risk factors include advanced age.  PT/OT no therapy needs.  Presented to [facility]/18/[facility]21 after an episode of dizziness after receiving a full body massage. Noted to have horizontal left-sided nystagmus as well as mild vertical nystagmus.  MRI negative for acute stroke.  Discharge with meclizine and Zofran and discussed benefit from a referral for outpatient PT for BPPV.   Initial office visit 03/20/2021 doing well without any residual stroke deficits or new stroke/TIA symptoms.  No recurrence of vertigo or dizziness. Reported compliance on aspirin and was started on atorvastatin 80 mg daily by PCP as LDL 93 (8/17).  Blood pressure today 121/81. Plans on starting cardiac monitor next week for 30 days.    Update 06/19/2021 JM: Returns for 38-month stroke follow-up.  Overall stable.  Denies new or reoccurring stroke/TIA symptoms.  He has returned back to all prior activities and is very active.  No additional vertigo type symptoms - eval by PT for vestibular rehab without evidence of peripheral or central impairments -- no further PT needs. Compliant on  aspirin -denies side effects. Has since stopped atorvastatin - he has been making dietary changes and plan to repeat lipid panel in Feb with PCP.  Blood pressure today 129/85. S/p ILR placement 04/29/2021 which has not shown atrial fibrillation thus far. No new concerns at this time   Update 01/08/2022 JM: returns for follow up visit after prior visit 7 months ago. Has been stable - denies new or reoccurring stroke/TIA symptoms. He has not had any additional vertigo episodes.  He continues to stay active.  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 He d/c'd his asa as it was causing issues with easy bruising and bleeding, denies any issues with bleeding in urine or stool. Blood pressure today 134/85.  Is closely followed by PCP.  Loop recorder has not shown atrial fibrillation thus far.  No new concerns at this time.      PERTINENT IMAGING  MR BRAIN 03/19/2021 [facility]) IMPRESSION: Mildly motion degraded exam.  No evidence of acute intracranial abnormality.  A known small chronic cortical/subcortical infarct within the paramedian posterior left frontal lobe was better appreciated on the prior brain MRI of 02/05/2021 (acute at that time).  Mild chronic small-vessel ischemic changes within the cerebral white matter, stable.  Redemonstrated small chronic infarcts within the bilateral cerebellar hemispheres.   MR BRAIN 02/05/2021 MR HEAD/NECK IMPRESSION: 1. Small focus of acute ischemia within the paramedian superior left frontal lobe. No hemorrhage or mass effect.  2. Normal MRA of the head and neck. 3. 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   2D ECHO 02/06/2021 IMPRESSIONS   1. Left ventricular ejection fraction, by estimation, is 55%. The left  ventricle has normal function. The left ventricle has no regional wall  motion abnormalities. Left ventricular diastolic parameters are consistent  with Grade I diastolic  dysfunction  (impaired relaxation).   2. Right ventricular systolic function is normal. The right ventricular  size is normal. There is normal pulmonary artery systolic pressure. The  estimated right ventricular systolic pressure is 29.6 mmHg.   3. The mitral valve is normal in structure. Trivial mitral valve  regurgitation. No evidence of mitral stenosis.   4. The aortic valve is tricuspid. Aortic valve regurgitation is trivial.  Mild aortic valve sclerosis is present, with no evidence of aortic valve  stenosis.   5. Aortic dilatation noted. There is mild dilatation of the ascending  aorta, measuring 41 mm.   6. The inferior vena cava is normal in size with greater than 50%  respiratory variability, suggesting right atrial pressure of 3 mmHg.         ROS:   14 system review of systems performed and negative with exception of those listed in HPI  PMH:  Past Medical History:  Diagnosis Date   Hx of TIA (transient ischemic attack) and stroke    Hx of vertigo     PSH:  Past Surgical History:  Procedure Laterality Date   KNEE SURGERY Right    TONSILLECTOMY      Social History:  Social History   Socioeconomic History   Marital status: Married    Spouse name: Not on file   Number of children: Not on file   Years of education: Not on file   Highest education level: Not on file  Occupational History   Not on file  Tobacco Use   Smoking status: Never   Smokeless tobacco: Never  Substance and Sexual Activity   Alcohol use: Not Currently   Drug use: Not Currently   Sexual activity: Not on file  Other Topics Concern   Not on file  Social History Narrative   Not on file   Social Determinants of Health   Financial Resource Strain: Not on file  Food Insecurity: Not on file  Transportation Needs: Not on file  Physical Activity: Not on file  Stress: Not on file  Social Connections: Not on file  Intimate Partner Violence: Not on file    Family History:  Family  History  Problem Relation Age of Onset   Heart disease Father    Heart disease Paternal Grandmother    Heart disease Paternal Grandfather     Medications:   No current outpatient medications on file prior to visit.   No current facility-administered medications on file prior to visit.    Allergies:  No Known Allergies    OBJECTIVE:  Physical Exam  Vitals:   01/08/22 1407  BP: 134/85  Pulse: (!) 57  Weight: 209 lb (94.8 kg)  Height: 6\' 1"  (1.854 m)   Body mass index is 27.57 kg/m. No results found.  General: well developed, well nourished, very pleasant elderly Caucasian male, seated, in no evident distress Head: head normocephalic and atraumatic.   Neck: supple with no carotid or supraclavicular bruits Cardiovascular: regular rate and rhythm, no murmurs Musculoskeletal: no deformity Skin:  no rash/petichiae Vascular:  Normal pulses all extremities   Neurologic Exam Mental Status: Awake and fully  alert. Fluent speech and language. Oriented to place and time. Recent and remote memory intact. Attention span, concentration and fund of knowledge appropriate. Mood and affect appropriate.  Cranial Nerves: Pupils equal, briskly reactive to light. Extraocular movements full without nystagmus. Visual fields full to confrontation. Hearing intact. Facial sensation intact. Face, tongue, palate moves normally and symmetrically.  Motor: Normal bulk and tone. Normal strength in all tested extremity muscles Sensory.: intact to touch , pinprick , position and vibratory sensation.  Coordination: Rapid alternating movements normal in all extremities. Finger-to-nose and heel-to-shin performed accurately bilaterally. Gait and Station: Arises from chair without difficulty. Stance is normal. Gait demonstrates normal stride length and balance.  Tandem walk and heel toe without difficulty.  Reflexes: 1+ and symmetric. Toes downgoing.        ASSESSMENT: Thomas Roberts is a 76 y.o.  year old male with recent left frontal lobe infarct, embolic cryptogenic etiology on 02/05/2021 without residual deficits.  Vascular risk factors include advanced age and HLD only.  New onset transient dizziness 03/18/2021 with full ER eval unremarkable possibly BPPV without any recurrence since that time     PLAN:  Left frontal lobe infarct:  Doing very well from a stroke standpoint without residual deficits.  From a stroke standpoint, he has no restrictions or limitations. S/p ILR 04/29/2021 - no evidence of A fib thus far.   Discussed indication for aspirin 81 mg daily and highly recommended restarting for ongoing secondary stroke prevention measures.  Declines interest in statin therapy. Reports cholesterol levels routinely monitored by PCP which have been stable Discussed secondary stroke prevention measures and importance of close PCP follow up for aggressive stroke risk factor management including HLD with LDL goal <70.  I have gone over the pathophysiology of stroke, warning signs and symptoms, risk factors and their management in some detail with instructions to go to the closest emergency room for symptoms of concern. Episodic vertigo: unknown etiology. MR brain 8/30 no acute findings. No additional events.      Doing well from stroke standpoint without further recommendations and risk factors are managed by PCP. He may follow up PRN, as usual for our patients who are strictly being followed for stroke. If any new neurological issues should arise, request PCP place referral for evaluation by one of our neurologists. Thank you.     CC:  PCP: Brayton Mars, MD    I spent 26 minutes of face-to-face and non-face-to-face time with patient.  This included previsit chart review, lab review, study review, electronic health record documentation, patient education regarding prior stroke, secondary stroke prevention measures and importance of managing stroke risk factors, prior transient  episode of vertigo and answered all other questions to patient satisfaction  Ihor Austin, [facility]  [facility] [address] [place], [place] [address]  Phone [phone] Fax [phone] Note: This document was prepared with digital dictation and possible smart phrase technology. Any transcriptional errors that result from this process are unintentional.

## 2022-01-20 NOTE — Progress Notes (Signed)
Carelink Summary Report / Loop Recorder 

## 2022-01-26 LAB — CUP PACEART REMOTE DEVICE CHECK
Date Time Interrogation Session: 20230708230613
Implantable Pulse Generator Implant Date: 20221010

## 2022-02-03 ENCOUNTER — Ambulatory Visit (INDEPENDENT_AMBULATORY_CARE_PROVIDER_SITE_OTHER): Payer: 59

## 2022-02-03 DIAGNOSIS — I639 Cerebral infarction, unspecified: Secondary | ICD-10-CM

## 2022-02-05 ENCOUNTER — Ambulatory Visit: Payer: Medicare Other | Admitting: Adult Health

## 2022-03-07 NOTE — Progress Notes (Signed)
Carelink Summary Report / Loop Recorder 

## 2022-03-10 ENCOUNTER — Ambulatory Visit (INDEPENDENT_AMBULATORY_CARE_PROVIDER_SITE_OTHER): Payer: 59

## 2022-03-10 DIAGNOSIS — I639 Cerebral infarction, unspecified: Secondary | ICD-10-CM | POA: Diagnosis not present

## 2022-03-12 LAB — CUP PACEART REMOTE DEVICE CHECK
Date Time Interrogation Session: 20230820231203
Implantable Pulse Generator Implant Date: 20221010

## 2022-04-06 NOTE — Progress Notes (Signed)
Carelink Summary Report / Loop Recorder 

## 2022-04-14 ENCOUNTER — Ambulatory Visit (INDEPENDENT_AMBULATORY_CARE_PROVIDER_SITE_OTHER): Payer: 59

## 2022-04-14 DIAGNOSIS — I639 Cerebral infarction, unspecified: Secondary | ICD-10-CM | POA: Diagnosis not present

## 2022-04-14 LAB — CUP PACEART REMOTE DEVICE CHECK
Date Time Interrogation Session: 20230922230630
Implantable Pulse Generator Implant Date: 20221010

## 2022-04-24 NOTE — Progress Notes (Signed)
Carelink Summary Report / Loop Recorder 

## 2022-05-16 LAB — CUP PACEART REMOTE DEVICE CHECK
Date Time Interrogation Session: 20231025231656
Implantable Pulse Generator Implant Date: 20221010

## 2022-05-19 ENCOUNTER — Ambulatory Visit (INDEPENDENT_AMBULATORY_CARE_PROVIDER_SITE_OTHER): Payer: 59

## 2022-05-19 DIAGNOSIS — I639 Cerebral infarction, unspecified: Secondary | ICD-10-CM | POA: Diagnosis not present

## 2022-06-18 NOTE — Progress Notes (Signed)
Carelink Summary Report / Loop Recorder 

## 2022-06-23 ENCOUNTER — Ambulatory Visit (INDEPENDENT_AMBULATORY_CARE_PROVIDER_SITE_OTHER): Payer: 59

## 2022-06-23 DIAGNOSIS — I639 Cerebral infarction, unspecified: Secondary | ICD-10-CM

## 2022-06-23 LAB — CUP PACEART REMOTE DEVICE CHECK
Date Time Interrogation Session: 20231203232028
Implantable Pulse Generator Implant Date: 20221010

## 2022-07-28 ENCOUNTER — Ambulatory Visit (INDEPENDENT_AMBULATORY_CARE_PROVIDER_SITE_OTHER): Payer: Medicare HMO

## 2022-07-28 DIAGNOSIS — I639 Cerebral infarction, unspecified: Secondary | ICD-10-CM | POA: Diagnosis not present

## 2022-07-29 LAB — CUP PACEART REMOTE DEVICE CHECK
Date Time Interrogation Session: 20240107232653
Implantable Pulse Generator Implant Date: 20221010

## 2022-07-31 NOTE — Progress Notes (Signed)
Carelink Summary Report / Loop Recorder 

## 2022-08-31 LAB — CUP PACEART REMOTE DEVICE CHECK
Date Time Interrogation Session: 20240209231241
Implantable Pulse Generator Implant Date: 20221010

## 2022-09-01 ENCOUNTER — Ambulatory Visit: Payer: Medicare HMO

## 2022-09-01 DIAGNOSIS — I639 Cerebral infarction, unspecified: Secondary | ICD-10-CM | POA: Diagnosis not present

## 2022-09-01 NOTE — Progress Notes (Signed)
Carelink Summary Report / Loop Recorder 

## 2022-10-06 ENCOUNTER — Ambulatory Visit (INDEPENDENT_AMBULATORY_CARE_PROVIDER_SITE_OTHER): Payer: Medicare HMO

## 2022-10-06 DIAGNOSIS — I639 Cerebral infarction, unspecified: Secondary | ICD-10-CM | POA: Diagnosis not present

## 2022-10-07 LAB — CUP PACEART REMOTE DEVICE CHECK
Date Time Interrogation Session: 20240317232715
Implantable Pulse Generator Implant Date: 20221010

## 2022-10-14 NOTE — Progress Notes (Signed)
Carelink Summary Report / Loop Recorder 

## 2022-11-07 LAB — CUP PACEART REMOTE DEVICE CHECK
Date Time Interrogation Session: 20240419230859
Implantable Pulse Generator Implant Date: 20221010

## 2022-11-10 ENCOUNTER — Ambulatory Visit (INDEPENDENT_AMBULATORY_CARE_PROVIDER_SITE_OTHER): Payer: Medicare HMO

## 2022-11-10 DIAGNOSIS — I639 Cerebral infarction, unspecified: Secondary | ICD-10-CM | POA: Diagnosis not present

## 2022-11-14 NOTE — Progress Notes (Signed)
Carelink Summary Report / Loop Recorder 

## 2022-12-11 ENCOUNTER — Ambulatory Visit (INDEPENDENT_AMBULATORY_CARE_PROVIDER_SITE_OTHER): Payer: Medicare HMO

## 2022-12-11 DIAGNOSIS — I639 Cerebral infarction, unspecified: Secondary | ICD-10-CM | POA: Diagnosis not present

## 2022-12-11 LAB — CUP PACEART REMOTE DEVICE CHECK
Date Time Interrogation Session: 20240522231021
Implantable Pulse Generator Implant Date: 20221010

## 2022-12-16 NOTE — Progress Notes (Signed)
Carelink Summary Report / Loop Recorder 

## 2022-12-31 NOTE — Progress Notes (Signed)
Carelink Summary Report / Loop Recorder 

## 2023-01-12 ENCOUNTER — Ambulatory Visit (INDEPENDENT_AMBULATORY_CARE_PROVIDER_SITE_OTHER): Payer: Medicare HMO

## 2023-01-12 DIAGNOSIS — I639 Cerebral infarction, unspecified: Secondary | ICD-10-CM

## 2023-01-12 LAB — CUP PACEART REMOTE DEVICE CHECK
Date Time Interrogation Session: 20240623231106
Implantable Pulse Generator Implant Date: 20221010

## 2023-01-30 NOTE — Progress Notes (Signed)
Carelink Summary Report / Loop Recorder 

## 2023-02-12 ENCOUNTER — Ambulatory Visit (INDEPENDENT_AMBULATORY_CARE_PROVIDER_SITE_OTHER): Payer: Medicare HMO

## 2023-02-12 DIAGNOSIS — I639 Cerebral infarction, unspecified: Secondary | ICD-10-CM

## 2023-02-24 NOTE — Progress Notes (Signed)
Carelink Summary Report / Loop Recorder 

## 2023-03-19 ENCOUNTER — Ambulatory Visit (INDEPENDENT_AMBULATORY_CARE_PROVIDER_SITE_OTHER): Payer: Medicare HMO

## 2023-03-19 DIAGNOSIS — I639 Cerebral infarction, unspecified: Secondary | ICD-10-CM | POA: Diagnosis not present

## 2023-03-19 LAB — CUP PACEART REMOTE DEVICE CHECK
Date Time Interrogation Session: 20240828231355
Implantable Pulse Generator Implant Date: 20221010

## 2023-03-27 NOTE — Progress Notes (Signed)
Carelink Summary Report / Loop Recorder 

## 2023-04-21 ENCOUNTER — Ambulatory Visit: Payer: Medicare HMO

## 2023-04-21 DIAGNOSIS — I639 Cerebral infarction, unspecified: Secondary | ICD-10-CM | POA: Diagnosis not present

## 2023-04-21 LAB — CUP PACEART REMOTE DEVICE CHECK
Date Time Interrogation Session: 20240930231836
Implantable Pulse Generator Implant Date: 20221010

## 2023-05-05 NOTE — Progress Notes (Signed)
Carelink Summary Report / Loop Recorder 

## 2023-05-07 ENCOUNTER — Telehealth: Payer: Self-pay | Admitting: Cardiology

## 2023-05-07 NOTE — Telephone Encounter (Signed)
Letter typed for patient and left at front desk. Patient states he will pick up tomorrow 05/07/23.

## 2023-05-07 NOTE — Telephone Encounter (Signed)
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 He states he needs a letter from Dr. Elberta Fortis reporting that he has not had any episodes of afib or any other issues in the last 6 months.

## 2023-05-25 ENCOUNTER — Ambulatory Visit (INDEPENDENT_AMBULATORY_CARE_PROVIDER_SITE_OTHER): Payer: Medicare HMO

## 2023-05-25 DIAGNOSIS — I639 Cerebral infarction, unspecified: Secondary | ICD-10-CM

## 2023-05-25 LAB — CUP PACEART REMOTE DEVICE CHECK
Date Time Interrogation Session: 20241102230421
Implantable Pulse Generator Implant Date: 20221010

## 2023-06-04 ENCOUNTER — Ambulatory Visit: Payer: Medicare HMO | Admitting: Neurology

## 2023-06-04 ENCOUNTER — Telehealth: Payer: Self-pay | Admitting: Neurology

## 2023-06-04 ENCOUNTER — Encounter: Payer: Self-pay | Admitting: Neurology

## 2023-06-04 VITALS — BP 129/81 | HR 59 | Ht 73.0 in | Wt 209.4 lb

## 2023-06-04 DIAGNOSIS — I639 Cerebral infarction, unspecified: Secondary | ICD-10-CM

## 2023-06-04 DIAGNOSIS — Z8673 Personal history of transient ischemic attack (TIA), and cerebral infarction without residual deficits: Secondary | ICD-10-CM | POA: Diagnosis not present

## 2023-06-04 DIAGNOSIS — Q2112 Patent foramen ovale: Secondary | ICD-10-CM

## 2023-06-04 NOTE — Telephone Encounter (Signed)
Pt stopped by check out and asked for FAA reference numbers for all tests and paperwork to be documented: PI# [id number], MID# [id number], APP ID# [id number]

## 2023-06-04 NOTE — Patient Instructions (Addendum)
I had a long discussion with the patient regarding his remote stroke of cryptogenic etiology from which she is doing very well without any residual deficits or recurrent symptoms.  I recommended take aspirin 81 mg daily for stroke prevention and maintain aggressive risk factor modification with strict control of hypertension with blood pressure goal below 130/90, lipids with LDL cholesterol goal below 70 mg percent and diabetes with hemoglobin A1c goal below 6.5%.  I recommend we check transcranial Doppler bubble study to categorizes PFO even though he has a low ROPE score of 4 giving him only a 38% chance that his stroke was related to the PFO.  Check screening carotid ultrasound as well.  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  Will do a separate letter to the [facility] as per their requirements.  Return for follow-up in 4 months or call earlier if necessary

## 2023-06-04 NOTE — Progress Notes (Signed)
[facility] [address] [place]. [place] [address] [phone]       STROKE FOLLOW UP NOTE  [name] Date of Birth:  1945-10-06 Medical Record Number:  [id number]   Reason for Referral:  stroke follow up    SUBJECTIVE:   CHIEF COMPLAINT:  Chief Complaint  Patient presents with   New Patient (Initial Visit)    Patient in room #20 and alone. 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 Patient has history of a TIA two years ago.     HPI:  Update 06/04/2023 ; : He returns for follow-up after last visit more than a year ago.  He states he is doing well.  He has had no recurrent stroke or TIA symptoms.  Takes aspirin only as needed and not every day.  He has no residual deficits from his previous stroke.  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  He is fully independent in all activities of daily living and is very active.  He was recently seen by cardiology for cardiac clearance for his flying license and had an echocardiogram on 05/07/2023 at [facility] which showed normal ejection fraction but positive right-to-left bubble study.  Interestingly patient did not have any PFO on color Doppler on the previous echo that was done during his stroke in July 2022.  This most likely is a small right-to-left shunt of no clinical significance.  He continues to have a loop recorder and so far paroxysmal A-fib has not yet been found.  His upcoming appointment to see his primary care physician and cardiologist also to get clearance to renew his flying license.  He is very healthy.  He does not smoke cigarettes or drink alcohol.  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  He has no new neurological complaints today.   Initial visit 03/20/2021 [name] is a 77 y.o. 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 Recommended loop recorder to evaluate for possible A. Fib - referral to EP to discuss outpatient. LDL 67.  A1c 5.3.  Recommended DAPT for 3 weeks followed by aspirin alone.  Other stroke risk factors include advanced age.  PT/OT no therapy needs.  Presented to [facility]/18/2021 after an episode of dizziness after receiving a full body massage. Noted to have horizontal left-sided nystagmus as well as mild vertical nystagmus.  MRI negative for acute stroke.  Discharge with meclizine and Zofran and discussed benefit from a referral for outpatient PT for BPPV.   Initial office visit 03/20/2021 doing well without any residual stroke deficits or new stroke/TIA symptoms.  No recurrence of vertigo or dizziness. Reported compliance on aspirin and was started on atorvastatin 80 mg daily by PCP as LDL 93 (8/17).  Blood pressure today 121/81. Plans on starting cardiac monitor next week for 30 days.    Update 06/19/2021 JM: Returns for 39-month stroke follow-up.  Overall stable.  Denies new or reoccurring stroke/TIA symptoms.  He has returned back to all prior activities and is very active.  No additional vertigo type symptoms - eval by PT for vestibular rehab without evidence of peripheral or central impairments -- no further PT needs. Compliant on aspirin -  denies side effects. Has since stopped atorvastatin - he has been making dietary changes and plan to repeat lipid panel in Feb with PCP.  Blood pressure today 129/85. S/p ILR placement 04/29/2021 which has not shown atrial fibrillation thus far. No new concerns at this time   Update 01/08/2022 JM: returns for follow up visit after prior visit 7 months ago. Has been stable - denies new or reoccurring stroke/TIA symptoms. He has not had any additional vertigo episodes.  He continues to stay active.  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 He d/c'd his asa as it was causing issues with easy bruising and bleeding, denies any issues with bleeding in urine or stool. Blood pressure today 134/85.  Is closely followed by PCP.  Loop recorder has not shown atrial fibrillation thus far.  No new concerns at this time.      PERTINENT IMAGING  MR BRAIN 03/19/2021 [facility]) IMPRESSION: Mildly motion degraded exam.  No evidence of acute intracranial abnormality.  A known small chronic cortical/subcortical infarct within the paramedian posterior left frontal lobe was better appreciated on the prior brain MRI of 02/05/2021 (acute at that time).  Mild chronic small-vessel ischemic changes within the cerebral white matter, stable.  Redemonstrated small chronic infarcts within the bilateral cerebellar hemispheres.   MR BRAIN 02/05/2021 MR HEAD/NECK IMPRESSION: 1. Small focus of acute ischemia within the paramedian superior left frontal lobe. No hemorrhage or mass effect. 2. Normal MRA of the head and neck. 3. 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   2D ECHO 02/06/2021 IMPRESSIONS   1. Left ventricular ejection fraction, by estimation, is 55%. The left  ventricle has normal function. The left ventricle has no regional wall  motion abnormalities. Left ventricular diastolic parameters are consistent  with Grade I diastolic dysfunction  (impaired relaxation).   2. Right ventricular systolic function is normal. The right ventricular  size is normal. There is normal pulmonary artery systolic pressure. The  estimated right ventricular systolic pressure is 29.6 mmHg.   3. The mitral valve is normal in structure. Trivial mitral valve  regurgitation. No evidence of mitral stenosis.   4. The aortic valve is tricuspid. Aortic valve regurgitation is trivial.  Mild aortic valve sclerosis is present, with no evidence of aortic valve  stenosis.   5. Aortic  dilatation noted. There is mild dilatation of the ascending  aorta, measuring 41 mm.   6. The inferior vena cava is normal in size with greater than 50%  respiratory variability, suggesting right atrial pressure of 3 mmHg.         ROS:   14 system review of systems performed and negative with exception of those listed in HPI  PMH:  Past Medical History:  Diagnosis Date   Hx of TIA (transient ischemic attack) and stroke    Hx of vertigo     PSH:  Past Surgical History:  Procedure Laterality Date   KNEE SURGERY Right    TONSILLECTOMY      Social History:  Social History   Socioeconomic History   Marital status: Married    Spouse name: Not on file   Number of children: Not on file   Years of education: Not on file   Highest education level: Not on file  Occupational History   Not on file  Tobacco Use   Smoking status: Never   Smokeless tobacco: Never  Substance and Sexual Activity   Alcohol  use: Not Currently   Drug use: Not Currently   Sexual activity: Not on file  Other Topics Concern   Not on file  Social History Narrative   Not on file   Social Determinants of Health   Financial Resource Strain: Low Risk  (05/26/2022)   Received from [facility], [facility]   Overall Financial Resource Strain (CARDIA)    Difficulty of Paying Living Expenses: Not hard at all  Food Insecurity: No Food Insecurity (05/26/2022)   Received from [facility], [facility]   Hunger Vital Sign    Worried About Running Out of Food in the Last Year: Never true    Ran Out of Food in the Last Year: Never true  Transportation Needs: No Transportation Needs (05/26/2022)   Received from [facility], [facility]   [facility] - Transportation    In the past 12 months, has lack of transportation kept you from medical appointments or from getting medications?: No    Lack of  Transportation (Non-Medical): No  Physical Activity: Sufficiently Active (03/06/2021)   Received from [facility], [facility]   Exercise Vital Sign    Days of Exercise per Week: 2 days    Minutes of Exercise per Session: 90 min  Stress: No Stress Concern Present (03/06/2021)   Received from [facility], [facility]   [employer] of Occupational Health - Occupational Stress Questionnaire    Feeling of Stress : Not at all  Social Connections: Unknown (12/01/2021)   Received from [facility], [facility]   Social Network    Social Network: Not on file  Intimate Partner Violence: Unknown (10/23/2021)   Received from [facility], [facility]   HITS    Physically Hurt: Not on file    Insult or Talk Down To: Not on file    Threaten Physical Harm: Not on file    Scream or Curse: Not on file    Family History:  Family History  Problem Relation Age of Onset   Heart disease Father    Heart disease Paternal Grandmother    Heart disease Paternal Grandfather     Medications:   No current outpatient medications on file prior to visit.   No current facility-administered medications on file prior to visit.    Allergies:  No Known Allergies    OBJECTIVE:  Physical Exam  Vitals:   06/04/23 0746  BP: 129/81  Pulse: (!) 59  Weight: 209 lb 6.4 oz (95 kg)  Height: 6\' 1"  (1.854 m)   Body mass index is 27.63 kg/m. No results found.  General: well developed, well nourished, very pleasant elderly Caucasian male, seated, in no evident distress Head: head normocephalic and atraumatic.   Neck: supple with no carotid or supraclavicular bruits Cardiovascular: regular rate and rhythm, no murmurs Musculoskeletal: no deformity Skin:  no rash/petichiae Vascular:  Normal pulses all extremities   Neurologic Exam Mental Status: Awake and fully alert. Fluent speech and language. Oriented to place and time.  Recent and remote memory intact. Attention span, concentration and fund of knowledge appropriate. Mood and affect appropriate.  Cranial Nerves: Pupils equal, briskly reactive to light. Extraocular movements full without nystagmus. Visual fields full to confrontation. Hearing intact. Facial sensation intact. Face, tongue, palate moves normally and symmetrically.  Motor: Normal bulk and tone. Normal strength in all tested extremity muscles Sensory.: intact to touch , pinprick ,  position and vibratory sensation.  Coordination: Rapid alternating movements normal in all extremities. Finger-to-nose and heel-to-shin performed accurately bilaterally. Gait and Station: Arises from chair without difficulty. Stance is normal. Gait demonstrates normal stride length and balance.  Tandem walk and heel toe without difficulty.  Reflexes: 1+ and symmetric. Toes downgoing.    NIHSS 0 Modified Rankin 0    ASSESSMENT: Thomas Roberts is a 77 y.o. year old male with recent left frontal lobe infarct, embolic cryptogenic etiology on 02/05/2021 without residual deficits.  Vascular risk factors include advanced age and HLD only.  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    PLAN: I had a long discussion with the patient regarding his remote stroke of cryptogenic etiology from which she is doing very well without any residual deficits or recurrent symptoms.  I recommended take aspirin 81 mg daily for stroke prevention and maintain aggressive risk factor modification with strict control of hypertension with blood pressure goal below 130/90, lipids with LDL cholesterol goal below 70 mg percent and diabetes with hemoglobin A1c goal below 6.5%.  I recommend we check transcranial Doppler bubble study to categorizes PFO even though he has a low ROPE score of 4 giving him only a 38% chance that his stroke was related to the PFO.  Check screening carotid  ultrasound as well.  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  Will do a separate letter to the [facility] as per their requirements.  Return for follow-up in 4 months or call earlier if necessary      Greater than 50% time during this 40-minute visit was spent in counseling and coordination of care about his remote stroke and new finding of possible right-to-left shunt on echocardiogram and discussion about further evaluation and treatment plan and answering questions. Delia Heady, MD  [facility] [address] [place], [place] [address]  Phone [phone] Fax [phone] Note: This document was prepared with digital dictation and possible smart phrase technology. Any transcriptional errors that result from this process are unintentional.

## 2023-06-15 NOTE — Progress Notes (Signed)
Carelink Summary Report / Loop Recorder 

## 2023-06-26 ENCOUNTER — Ambulatory Visit (HOSPITAL_COMMUNITY)
Admission: RE | Admit: 2023-06-26 | Discharge: 2023-06-26 | Disposition: A | Discharge status: Home / Self Care | Payer: Medicare HMO | Source: Ambulatory Visit | Attending: Neurology | Admitting: Neurology

## 2023-06-26 ENCOUNTER — Ambulatory Visit (HOSPITAL_BASED_OUTPATIENT_CLINIC_OR_DEPARTMENT_OTHER)
Admission: RE | Admit: 2023-06-26 | Discharge: 2023-06-26 | Disposition: A | Discharge status: Home / Self Care | Payer: Medicare HMO | Source: Ambulatory Visit | Attending: Neurology | Admitting: Neurology

## 2023-06-26 DIAGNOSIS — Q2112 Patent foramen ovale: Secondary | ICD-10-CM | POA: Insufficient documentation

## 2023-06-26 DIAGNOSIS — I639 Cerebral infarction, unspecified: Secondary | ICD-10-CM | POA: Diagnosis not present

## 2023-06-26 DIAGNOSIS — Z8673 Personal history of transient ischemic attack (TIA), and cerebral infarction without residual deficits: Secondary | ICD-10-CM | POA: Insufficient documentation

## 2023-06-29 ENCOUNTER — Ambulatory Visit (INDEPENDENT_AMBULATORY_CARE_PROVIDER_SITE_OTHER): Payer: Medicare HMO

## 2023-06-29 DIAGNOSIS — I639 Cerebral infarction, unspecified: Secondary | ICD-10-CM

## 2023-06-29 LAB — CUP PACEART REMOTE DEVICE CHECK
Date Time Interrogation Session: 20241208230713
Implantable Pulse Generator Implant Date: 20221010

## 2023-07-02 NOTE — Progress Notes (Signed)
These results were communicated to the patient at the time of the study.

## 2023-07-02 NOTE — Progress Notes (Signed)
Kindly inform the patient that carotid ultrasound study shows no significant narrowing of either carotid artery in the neck

## 2023-07-30 ENCOUNTER — Encounter: Payer: Self-pay | Admitting: Neurology

## 2023-08-03 ENCOUNTER — Ambulatory Visit (INDEPENDENT_AMBULATORY_CARE_PROVIDER_SITE_OTHER): Payer: Medicare HMO

## 2023-08-03 DIAGNOSIS — I639 Cerebral infarction, unspecified: Secondary | ICD-10-CM

## 2023-08-03 LAB — CUP PACEART REMOTE DEVICE CHECK
Date Time Interrogation Session: 20250112230909
Implantable Pulse Generator Implant Date: 20221010

## 2023-09-07 ENCOUNTER — Ambulatory Visit (INDEPENDENT_AMBULATORY_CARE_PROVIDER_SITE_OTHER): Payer: Medicare HMO

## 2023-09-07 DIAGNOSIS — I639 Cerebral infarction, unspecified: Secondary | ICD-10-CM

## 2023-09-08 LAB — CUP PACEART REMOTE DEVICE CHECK
Date Time Interrogation Session: 20250216230742
Implantable Pulse Generator Implant Date: 20221010

## 2023-09-14 NOTE — Progress Notes (Signed)
 Carelink Summary Report / Loop Recorder

## 2023-10-08 ENCOUNTER — Telehealth: Payer: Self-pay | Admitting: Neurology

## 2023-10-08 NOTE — Telephone Encounter (Signed)
 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 They will need an updated Neuro letter from [place] within last 90 days. Current letter will technically be out of date April 9th. Pt is rq a new letter stating same information and to note specifically TIA. Will need MD signature, pt will pick up tomorrow.

## 2023-10-08 NOTE — Telephone Encounter (Signed)
Letter signed and placed up front for pt pick-up.

## 2023-10-08 NOTE — Telephone Encounter (Signed)
 Pt called wanting to speak to the RN to discuss the letter he is needing. Please advise.

## 2023-10-12 ENCOUNTER — Ambulatory Visit (INDEPENDENT_AMBULATORY_CARE_PROVIDER_SITE_OTHER): Payer: Medicare HMO

## 2023-10-12 DIAGNOSIS — I639 Cerebral infarction, unspecified: Secondary | ICD-10-CM

## 2023-10-12 LAB — CUP PACEART REMOTE DEVICE CHECK
Date Time Interrogation Session: 20250323230727
Implantable Pulse Generator Implant Date: 20221010

## 2023-10-13 NOTE — Progress Notes (Signed)
 Carelink Summary Report / Loop Recorder

## 2023-10-13 NOTE — Addendum Note (Signed)
 Addended by: Geralyn Flash D on: 10/13/2023 04:34 PM   Modules accepted: Orders

## 2023-11-16 ENCOUNTER — Ambulatory Visit: Payer: Medicare HMO

## 2023-11-16 DIAGNOSIS — I639 Cerebral infarction, unspecified: Secondary | ICD-10-CM | POA: Diagnosis not present

## 2023-11-16 LAB — CUP PACEART REMOTE DEVICE CHECK
Date Time Interrogation Session: 20250427231001
Implantable Pulse Generator Implant Date: 20221010

## 2023-11-27 NOTE — Progress Notes (Signed)
 Carelink Summary Report / Loop Recorder

## 2023-12-21 ENCOUNTER — Ambulatory Visit: Payer: Self-pay | Admitting: Cardiology

## 2023-12-21 ENCOUNTER — Telehealth: Payer: Self-pay | Admitting: Neurology

## 2023-12-21 ENCOUNTER — Ambulatory Visit (INDEPENDENT_AMBULATORY_CARE_PROVIDER_SITE_OTHER)

## 2023-12-21 DIAGNOSIS — I639 Cerebral infarction, unspecified: Secondary | ICD-10-CM | POA: Diagnosis not present

## 2023-12-21 LAB — CUP PACEART REMOTE DEVICE CHECK
Date Time Interrogation Session: 20250601231438
Implantable Pulse Generator Implant Date: 20221010

## 2023-12-21 NOTE — Telephone Encounter (Signed)
 Pt is asking for a call from [place], [place].  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

## 2023-12-28 NOTE — Progress Notes (Signed)
 Thomas Roberts: Thomas Marcos Rous, MD  CC: Chief Complaint  Patient presents with  . Follow-up    NO CONCERNS    HPI:  This is a 78 y.o. year old male here for a return visit for discussion of health documentation for aviation medical clearance and also routine health issues.  Feeling well.   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 On ASA. Last MRI 04/29/23 done at [facility] noted mild to moderate changes most likely related to chronic ischemic small vessel disease. Has small PFO.  Has seen [facility]. No neurological changes. No hx of arrhythmia or new dx of one via ILR   Has see cardiology Thomas Roberts last in early April. and had echo stress test (10/15/23)  Flowsheet Rows    Flowsheet Row Initial consult from 10/28/2023 in [facility] ECHO STRESS TEST from 10/15/2023 in [facility] Visit from [facility]/21/2023 in [facility]  BP 125/82 142/95* 145/81*   BP today 130/82 without medications.   R knee MRI - ice if pain was the plan with Thomas Thomas Roberts   History of Present Illness Thomas Roberts is a 78 year old male who presents for follow-up regarding his medical certification and knee pain.  He is preparing to meet with his Investment banker, operational in [place], Thomas Roberts , to submit results from recent tests required for reconsideration of his medical certification. He underwent an echocardiogram on a treadmill in March, which he completed successfully, exceeding the required six minutes by four additional minutes. A neurologist in [place] and a neuropsychologist also evaluated him, with the latter conducting nine and a half hours of testing. He scored in the 90th percentile on certain cognitive tests, although his performance decreased to average under time constraints.  He has an MRI on his right knee, which showed fraying of the meniscus and a small cyst. He experiences occasional pain, for  which he uses ice and Aleve as needed. He continues to play soccer and recently participated in a [employer], where his team won a Public relations account executive.  He has a history of undergoing a colonoscopy recently. He is currently on baby aspirin  and reports no issues with his blood pressure or stomach. He has received various vaccinations, including hepatitis A and B, typhoid, and others, and carries a shot card with his passport for travel purposes.   Current Outpatient Medications  Medication Sig Dispense Refill  . aspirin  81 MG EC tablet Take 1 tablet (81 mg total) by mouth once daily     No current facility-administered medications for this visit.    The following portions of the patient's history were reviewed and updated in the [facility] as appropriate: allergies, current medications, past family history, past medical history, past social history, past surgical history and problem list.   Physical Exam:  Vitals: BP 130/82 (BP Location: Left forearm, Patient Position: Sitting, BP Cuff Size: Adult)   Pulse 62   Temp 36.8 C (98.2 F) (Oral)   Resp 16   Wt 95.9 kg (211 lb 6.7 oz)   SpO2 100%   BMI 27.42 kg/m    GENERAL: Healthy appearing, no acute distress, well developed, well nourished.   Assessment and Plan:  Diagnoses and all orders for this visit:  History of CVA (cerebrovascular accident)  Chronic pain of right knee   Assessment & Plan Knee Meniscus Tear Tear of the lateral meniscus in the right knee with  free edge fraying and a small cyst present. No significant tear on the medial side. Managed conservatively as he can play soccer without significant issues. - Advise use of ice for pain management. - Recommend Aleve for pain management as needed.  FAA Medical Certification Undergoing renewal process for [facility] medical certification. Completed required tests, including echocardiogram on a treadmill, neurological evaluation, and neuropsychological assessment.  All results favorable, with high performance in cognitive tests. Advised to submit results for reconsideration. [facility] discussed as an alternative if current process fails.  General Health Maintenance Generally healthy, actively participating in soccer tournaments, and recently underwent colonoscopy. Considering shingles vaccine, covered by Medicare at local pharmacies. Advised to consider vaccines if traveling to [place] or [place]. Vaccination history includes polio, measles booster, tetanus, hepatitis A and B, and typhoid, though some may be expired. - Postpone shingles vaccine discussion until next year. - Consider shingles vaccine at local pharmacy, covered by Medicare. - Advise to consider travel vaccines if traveling to [place] or [place]. - Schedule physical examination for November 19th, 8:30 AM, with fasting blood work.  Disposition:  Future Appointments     Date/Time Provider Department Center Visit Type   06/08/2024 8:30 AM (Arrive by 8:15 AM) Thomas Marcos Rous, MD [facility] PHYSICAL      I spent a total of 30 minutes in both face-to-face and non-face-to-face activities for this visit on the date of this encounter.  This note has been created using automated tools and reviewed for accuracy by [facility].    *Some images could not be shown.

## 2023-12-31 NOTE — Progress Notes (Signed)
 Carelink Summary Report / Loop Recorder

## 2024-01-21 ENCOUNTER — Ambulatory Visit

## 2024-01-21 DIAGNOSIS — I639 Cerebral infarction, unspecified: Secondary | ICD-10-CM | POA: Diagnosis not present

## 2024-01-21 LAB — CUP PACEART REMOTE DEVICE CHECK
Date Time Interrogation Session: 20250702231806
Implantable Pulse Generator Implant Date: 20221010

## 2024-01-25 ENCOUNTER — Encounter: Payer: Self-pay | Admitting: Neurology

## 2024-01-25 ENCOUNTER — Ambulatory Visit: Payer: Medicare HMO | Admitting: Neurology

## 2024-01-25 ENCOUNTER — Ambulatory Visit: Payer: Self-pay | Admitting: Cardiology

## 2024-01-25 VITALS — BP 142/84 | HR 78 | Ht 74.0 in | Wt 211.0 lb

## 2024-01-25 DIAGNOSIS — Z8673 Personal history of transient ischemic attack (TIA), and cerebral infarction without residual deficits: Secondary | ICD-10-CM

## 2024-01-25 NOTE — Progress Notes (Signed)
 [facility] [address] [place]. [place] [address] [phone]       STROKE FOLLOW UP NOTE  Thomas Roberts Date of Birth:  March 17, 1946 Medical Record Number:  [id number]   Reason for Referral:  stroke follow up    SUBJECTIVE:   CHIEF COMPLAINT:  Chief Complaint  Patient presents with   Cerebrovascular Accident    Rm 3 alone Pt is well and stable, reports no new stroke concerns since last visit.      HPI:  Update 01/25/2024 : He returns for follow-up after last visit more than 6 months ago.  He is doing well.  He has had no recurrent stroke or TIA symptoms.  His loop recorder interrogation last on 01/20/2024 shows no evidence of paroxysmal A-fib yet.  Carotid ultrasound on 06/26/2023 shows no significant extracranial stenosis.  Transcranial Doppler bubble study in 06/26/2023 showed only a small to medium size right-to-left shunt.  Lab work on 05/07/2023 showed LDL-cholesterol to be 60 mg percent and hemoglobin A1c 5.4.  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  He remains on aspirin  which is tolerating well without bruising or bleeding.  He has no new complaints. Update 06/04/2023 ; : He returns for follow-up after last visit more than a year ago.  He states he is doing well.  He has had no recurrent stroke or TIA symptoms.  Takes aspirin  only as needed and not every day.  He has no residual deficits from his previous stroke.  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  He is  fully independent in all activities of daily living and is very active.  He was recently seen by cardiology for cardiac clearance for his flying license and had an echocardiogram on 05/07/2023 at [facility] which showed normal ejection fraction but positive right-to-left bubble study.  Interestingly patient did not have any PFO on color Doppler on the previous echo that was done during his stroke in July 2022.  This most likely is a small right-to-left shunt of no clinical significance.  He continues to have a loop recorder and so far paroxysmal A-fib has not yet been found.  His upcoming appointment to see his primary care physician and cardiologist also to get clearance to renew his flying license.  He is very healthy.  He does not smoke cigarettes or drink alcohol.  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  He has no new neurological complaints today.   Initial visit 03/20/2021 [name] is a 78 y.o. male with no significant medical problems that presented to [facility] ED on 02/05/2021 for acute onset right upper and lower extremity weakness and was found to have a small left front lobe infarct of cryptogenic etiology. Recommended loop recorder to evaluate for possible A. Fib - referral to EP to discuss outpatient. LDL 67.  A1c 5.3.  Recommended DAPT for 3 weeks followed by aspirin  alone.  Other stroke risk factors include advanced age.  PT/OT no therapy  needs.  Presented to [facility]/18/2021 after an episode of dizziness after receiving a full body massage. Noted to have horizontal left-sided nystagmus as well as mild vertical nystagmus.  MRI negative for acute stroke.  Discharge with meclizine  and Zofran  and discussed benefit from a referral for outpatient PT for BPPV.   Initial office visit 03/20/2021 doing well without any residual stroke deficits or new stroke/TIA symptoms.  No recurrence of vertigo or dizziness. Reported compliance on aspirin  and was started on  atorvastatin  80 mg daily by PCP as LDL 93 (8/17).  Blood pressure today 121/81. Plans on starting cardiac monitor next week for 30 days.    Update 06/19/2021 JM: Returns for 43-month stroke follow-up.  Overall stable.  Denies new or reoccurring stroke/TIA symptoms.  He has returned back to all prior activities and is very active.  No additional vertigo type symptoms - eval by PT for vestibular rehab without evidence of peripheral or central impairments -- no further PT needs. Compliant on aspirin  -denies side effects. Has since stopped atorvastatin  - he has been making dietary changes and plan to repeat lipid panel in Feb with PCP.  Blood pressure today 129/85. S/p ILR placement 04/29/2021 which has not shown atrial fibrillation thus far. No new concerns at this time   Update 01/08/2022 JM: returns for follow up visit after prior visit 7 months ago. Has been stable - denies new or reoccurring stroke/TIA symptoms. He has not had any additional vertigo episodes.  He continues to stay active.  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 He d/c'd his asa as it was causing issues with easy bruising and bleeding, denies any issues with bleeding in urine or stool. Blood pressure today 134/85.  Is closely followed by PCP.  Loop recorder has not shown atrial fibrillation thus far.  No new concerns at this time.      PERTINENT IMAGING  MR BRAIN 03/19/2021 [facility]) IMPRESSION: Mildly motion degraded exam.  No evidence of acute intracranial abnormality.  A known small chronic cortical/subcortical infarct within the paramedian posterior left frontal lobe was better appreciated on the prior brain MRI of 02/05/2021 (acute at that time).  Mild chronic small-vessel ischemic changes within the cerebral white matter, stable.  Redemonstrated small chronic infarcts within the bilateral cerebellar hemispheres.   MR BRAIN 02/05/2021 MR  HEAD/NECK IMPRESSION: 1. Small focus of acute ischemia within the paramedian superior left frontal lobe. No hemorrhage or mass effect. 2. Normal MRA of the head and neck. 3. 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   2D ECHO 02/06/2021 IMPRESSIONS   1. Left ventricular ejection fraction, by estimation, is 55%. The left  ventricle has normal function. The left ventricle has no regional wall  motion abnormalities. Left ventricular diastolic parameters are consistent  with Grade I diastolic dysfunction  (impaired relaxation).   2. Right ventricular systolic function is normal. The right ventricular  size is normal. There is normal pulmonary artery systolic pressure. The  estimated right ventricular systolic pressure is 29.6 mmHg.   3. The mitral valve is normal in structure. Trivial mitral valve  regurgitation. No evidence of mitral stenosis.   4. The aortic valve is tricuspid. Aortic valve regurgitation is trivial.  Mild aortic valve sclerosis is present, with no evidence of aortic valve  stenosis.   5. Aortic dilatation noted. There is mild dilatation of the ascending  aorta, measuring 41 mm.   6. The inferior vena  cava is normal in size with greater than 50%  respiratory variability, suggesting right atrial pressure of 3 mmHg.         ROS:   14 system review of systems performed and negative with exception of those listed in HPI  PMH:  Past Medical History:  Diagnosis Date   Hx of TIA (transient ischemic attack) and stroke    Hx of vertigo     PSH:  Past Surgical History:  Procedure Laterality Date   KNEE SURGERY Right    TONSILLECTOMY      Social History:  Social History   Socioeconomic History   Marital status: Married    Spouse name: Not on file   Number of children: Not on file   Years of education: Not on file   Highest education level: Not on file  Occupational History   Not on file  Tobacco Use   Smoking status: Never   Smokeless  tobacco: Never  Substance and Sexual Activity   Alcohol use: Not Currently   Drug use: Not Currently   Sexual activity: Not on file  Other Topics Concern   Not on file  Social History Narrative   Not on file   Social Drivers of Health   Financial Resource Strain: Patient Declined (06/04/2023)   Received from [facility]   Overall Financial Resource Strain (CARDIA)    Difficulty of Paying Living Expenses: Patient declined  Food Insecurity: Low Risk  (08/14/2023)   Received from [facility]   Hunger Vital Sign    Within the past 12 months, you worried that your food would run out before you got money to buy more: Never true    Within the past 12 months, the food you bought just didn't last and you didn't have money to get more. : Never true  Transportation Needs: No Transportation Needs (08/14/2023)   Received from [facility]    In the past 12 months, has lack of reliable transportation kept you from medical appointments, meetings, work or from getting things needed for daily living? : No  Physical Activity: Sufficiently Active (03/06/2021)   Received from [facility]   Exercise Vital Sign    On average, how many days per week do you engage in moderate to strenuous exercise (like a brisk walk)?: 2 days    On average, how many minutes do you engage in exercise at this level?: 90 min  Stress: No Stress Concern Present (03/06/2021)   Received from [facility] - Occupational Stress Questionnaire    Feeling of Stress : Not at all  Social Connections: Unknown (12/01/2021)   Received from [facility]   Social Network    Social Network: Not on file  Intimate Partner Violence: Unknown (10/23/2021)   Received from [facility]   HITS    Physically Hurt: Not on file    Insult or Talk Down To: Not on file    Threaten Physical Harm: Not on file    Scream or Curse: Not on  file    Family History:  Family History  Problem Relation Age of Onset   Heart disease Father    Heart disease Paternal Grandmother    Heart disease Paternal Grandfather     Medications:   Current Outpatient Medications on File Prior to Visit  Medication Sig Dispense Refill   aspirin  EC 81 MG tablet Take 81 mg by mouth daily.  Swallow whole.     No current facility-administered medications on file prior to visit.    Allergies:  No Known Allergies    OBJECTIVE:  Physical Exam  Vitals:   01/25/24 1353  BP: (!) 142/84  Pulse: 78  Weight: 211 lb (95.7 kg)  Height: 6' 2 (1.88 m)   Body mass index is 27.09 kg/m. No results found.  General: well developed, well nourished, very pleasant elderly Caucasian male, seated, in no evident distress Head: head normocephalic and atraumatic.   Neck: supple with no carotid or supraclavicular bruits Cardiovascular: regular rate and rhythm, no murmurs Musculoskeletal: no deformity Skin:  no rash/petichiae Vascular:  Normal pulses all extremities   Neurologic Exam Mental Status: Awake and fully alert. Fluent speech and language. Oriented to place and time. Recent and remote memory intact. Attention span, concentration and fund of knowledge appropriate. Mood and affect appropriate.  Cranial Nerves: Pupils equal, briskly reactive to light. Extraocular movements full without nystagmus. Visual fields full to confrontation. Hearing intact. Facial sensation intact. Face, tongue, palate moves normally and symmetrically.  Motor: Normal bulk and tone. Normal strength in all tested extremity muscles Sensory.: intact to touch , pinprick , position and vibratory sensation.  Coordination: Rapid alternating movements normal in all extremities. Finger-to-nose and heel-to-shin performed accurately bilaterally. Gait and Station: Arises from chair without difficulty. Stance is normal. Gait demonstrates normal stride length and balance.  Tandem walk and  heel toe without difficulty.  Reflexes: 1+ and symmetric. Toes downgoing.    NIHSS 0 Modified Rankin 0    ASSESSMENT: Thomas Roberts is a 78 y.o. year old male with recent left frontal lobe infarct, embolic cryptogenic etiology on 02/05/2021 without residual deficits.  Vascular risk factors include advanced age and HLD only.  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    PLAN: I had a long discussion with the patient regarding his remote stroke of cryptogenic etiology from which she is doing very well without any residual deficits or recurrent symptoms.  I recommended take aspirin  81 mg daily for stroke prevention and maintain aggressive risk factor modification with strict control of hypertension with blood pressure goal below 130/90, lipids with LDL cholesterol goal below 70 mg percent and diabetes with hemoglobin A1c goal below 6.5%.    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    Return for follow-up in the future only as needed  or call earlier if necessary     Greater than 50% time during this 35-minute visit was spent in counseling and coordination of care about his remote stroke and new finding of possible right-to-left shunt on echocardiogram and discussion about further evaluation and treatment plan and answering questions. Eather Popp, MD  [facility] [address] [place], [place] [address]  Phone [phone] Fax [phone] Note: This document was prepared with digital dictation and possible smart phrase technology. Any transcriptional errors that result from this process are unintentional.

## 2024-01-25 NOTE — Patient Instructions (Signed)
 I had a long discussion with the patient regarding his remote stroke of cryptogenic etiology from which she is doing very well without any residual deficits or recurrent symptoms.  I recommended take aspirin  81 mg daily for stroke prevention and maintain aggressive risk factor modification with strict control of hypertension with blood pressure goal below 130/90, lipids with LDL cholesterol goal below 70 mg percent and diabetes with hemoglobin A1c goal below 6.5%.    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    Return for follow-up in the future only as needed  or call earlier if necessary

## 2024-02-08 NOTE — Progress Notes (Signed)
 Carelink Summary Report / Loop Recorder

## 2024-02-22 ENCOUNTER — Ambulatory Visit

## 2024-02-22 DIAGNOSIS — I639 Cerebral infarction, unspecified: Secondary | ICD-10-CM | POA: Diagnosis not present

## 2024-02-23 ENCOUNTER — Ambulatory Visit: Payer: Self-pay | Admitting: Cardiology

## 2024-02-23 LAB — CUP PACEART REMOTE DEVICE CHECK
Date Time Interrogation Session: 20250802230713
Implantable Pulse Generator Implant Date: 20221010

## 2024-03-24 ENCOUNTER — Ambulatory Visit (INDEPENDENT_AMBULATORY_CARE_PROVIDER_SITE_OTHER)

## 2024-03-24 DIAGNOSIS — I639 Cerebral infarction, unspecified: Secondary | ICD-10-CM

## 2024-03-24 LAB — CUP PACEART REMOTE DEVICE CHECK
Date Time Interrogation Session: 20250903231240
Implantable Pulse Generator Implant Date: 20221010

## 2024-03-25 ENCOUNTER — Ambulatory Visit: Payer: Self-pay | Admitting: Cardiology

## 2024-04-02 NOTE — Progress Notes (Signed)
 Remote Loop Recorder Transmission

## 2024-04-18 NOTE — Progress Notes (Signed)
 Remote Loop Recorder Transmission

## 2024-04-25 ENCOUNTER — Encounter

## 2024-04-26 ENCOUNTER — Ambulatory Visit (INDEPENDENT_AMBULATORY_CARE_PROVIDER_SITE_OTHER)

## 2024-04-26 DIAGNOSIS — I639 Cerebral infarction, unspecified: Secondary | ICD-10-CM

## 2024-04-27 LAB — CUP PACEART REMOTE DEVICE CHECK
Date Time Interrogation Session: 20251006231014
Implantable Pulse Generator Implant Date: 20221010

## 2024-04-29 ENCOUNTER — Ambulatory Visit: Payer: Self-pay | Admitting: Cardiology

## 2024-04-29 NOTE — Progress Notes (Signed)
 Remote Loop Recorder Transmission

## 2024-05-26 ENCOUNTER — Encounter

## 2024-05-27 ENCOUNTER — Ambulatory Visit (INDEPENDENT_AMBULATORY_CARE_PROVIDER_SITE_OTHER)

## 2024-05-27 DIAGNOSIS — I639 Cerebral infarction, unspecified: Secondary | ICD-10-CM | POA: Diagnosis not present

## 2024-05-29 LAB — CUP PACEART REMOTE DEVICE CHECK
Date Time Interrogation Session: 20251106234826
Implantable Pulse Generator Implant Date: 20221010

## 2024-05-31 ENCOUNTER — Ambulatory Visit: Payer: Self-pay | Admitting: Cardiology

## 2024-05-31 NOTE — Progress Notes (Signed)
 Remote Loop Recorder Transmission

## 2024-06-27 ENCOUNTER — Ambulatory Visit

## 2024-06-28 LAB — CUP PACEART REMOTE DEVICE CHECK
Date Time Interrogation Session: 20251207231221
Implantable Pulse Generator Implant Date: 20221010

## 2024-06-29 ENCOUNTER — Ambulatory Visit: Payer: Self-pay | Admitting: Cardiology

## 2024-07-05 NOTE — Progress Notes (Signed)
 Remote Loop Recorder Transmission

## 2024-07-28 ENCOUNTER — Ambulatory Visit

## 2024-07-28 DIAGNOSIS — I639 Cerebral infarction, unspecified: Secondary | ICD-10-CM | POA: Diagnosis not present

## 2024-07-29 ENCOUNTER — Ambulatory Visit: Payer: Self-pay | Admitting: Cardiology

## 2024-07-29 LAB — CUP PACEART REMOTE DEVICE CHECK
Date Time Interrogation Session: 20260107231241
Implantable Pulse Generator Implant Date: 20221010

## 2024-07-29 NOTE — Progress Notes (Signed)
 Remote Loop Recorder Transmission

## 2024-08-28 ENCOUNTER — Encounter

## 2024-09-28 ENCOUNTER — Ambulatory Visit

## 2024-10-29 ENCOUNTER — Ambulatory Visit

## 2024-11-29 ENCOUNTER — Ambulatory Visit

## 2024-12-30 ENCOUNTER — Ambulatory Visit

## 2025-01-30 ENCOUNTER — Ambulatory Visit

## 2025-03-02 ENCOUNTER — Ambulatory Visit

## 2025-04-02 ENCOUNTER — Ambulatory Visit

## 2025-05-03 ENCOUNTER — Ambulatory Visit

## 2025-06-03 ENCOUNTER — Ambulatory Visit

## 2025-07-04 ENCOUNTER — Ambulatory Visit

## 2025-08-04 ENCOUNTER — Ambulatory Visit
